# Patient Record
Sex: Female | Born: 1970 | Race: White | Hispanic: No | Marital: Married | Smoking: Never smoker
Health system: Southern US, Community
[De-identification: ages and names within clinical notes are randomized; demographics above are authoritative.]

## PROBLEM LIST (undated history)

## (undated) DIAGNOSIS — G4700 Insomnia, unspecified: Secondary | ICD-10-CM

## (undated) DIAGNOSIS — N390 Urinary tract infection, site not specified: Secondary | ICD-10-CM

## (undated) DIAGNOSIS — K635 Polyp of colon: Secondary | ICD-10-CM

## (undated) DIAGNOSIS — F419 Anxiety disorder, unspecified: Secondary | ICD-10-CM

## (undated) DIAGNOSIS — K219 Gastro-esophageal reflux disease without esophagitis: Secondary | ICD-10-CM

## (undated) DIAGNOSIS — K649 Unspecified hemorrhoids: Secondary | ICD-10-CM

## (undated) DIAGNOSIS — Z8669 Personal history of other diseases of the nervous system and sense organs: Secondary | ICD-10-CM

## (undated) HISTORY — DX: Gastro-esophageal reflux disease without esophagitis: K21.9

## (undated) HISTORY — DX: Personal history of other diseases of the nervous system and sense organs: Z86.69

## (undated) HISTORY — DX: Unspecified hemorrhoids: K64.9

## (undated) HISTORY — DX: Urinary tract infection, site not specified: N39.0

## (undated) HISTORY — DX: Polyp of colon: K63.5

## (undated) HISTORY — PX: COLONOSCOPY: SHX5424

## (undated) HISTORY — DX: Anxiety disorder, unspecified: F41.9

## (undated) HISTORY — PX: PARTIAL HYSTERECTOMY: SHX80

## (undated) HISTORY — DX: Insomnia, unspecified: G47.00

## (undated) HISTORY — PX: ESOPHAGOGASTRODUODENOSCOPY: SHX1529

---

## 2008-09-02 ENCOUNTER — Ambulatory Visit: Payer: Self-pay | Admitting: *Deleted

## 2009-08-08 ENCOUNTER — Ambulatory Visit (HOSPITAL_COMMUNITY): Admission: RE | Admit: 2009-08-08 | Discharge: 2009-08-09 | Payer: Self-pay | Admitting: Obstetrics and Gynecology

## 2009-08-09 ENCOUNTER — Encounter (INDEPENDENT_AMBULATORY_CARE_PROVIDER_SITE_OTHER): Payer: Self-pay | Admitting: Obstetrics and Gynecology

## 2009-08-10 ENCOUNTER — Inpatient Hospital Stay (HOSPITAL_COMMUNITY): Admission: AD | Admit: 2009-08-10 | Discharge: 2009-08-10 | Payer: Self-pay | Admitting: Obstetrics

## 2009-08-16 ENCOUNTER — Encounter: Admission: RE | Admit: 2009-08-16 | Discharge: 2009-08-16 | Payer: Self-pay | Admitting: Obstetrics and Gynecology

## 2009-09-28 ENCOUNTER — Ambulatory Visit (HOSPITAL_COMMUNITY): Admission: RE | Admit: 2009-09-28 | Discharge: 2009-09-28 | Payer: Self-pay | Admitting: Obstetrics and Gynecology

## 2010-02-25 IMAGING — CT CT ABDOMEN W/ CM
2 series · 15 of 42 positions shown, 19 images · IV contrast (CONTRAST)
Comparison: None.

CT ABDOMEN

CLINICAL DATA: Nausea, vomiting, gas pain, fainting, loss of
appetite. Hysterectomy 08/08/2009.

CT ABDOMEN AND PELVIS WITH CONTRAST
TECHNIQUE: Multidetector CT imaging of the abdomen and pelvis was
performed using the standard protocol following bolus
administration of intravenous contrast.
Contrast: 100 ml Hmnipaque-YZZ

[coronals · coronal · 0.82mm/px · 3 of 70 slices shown]
[im 31/70  soft-tissue]
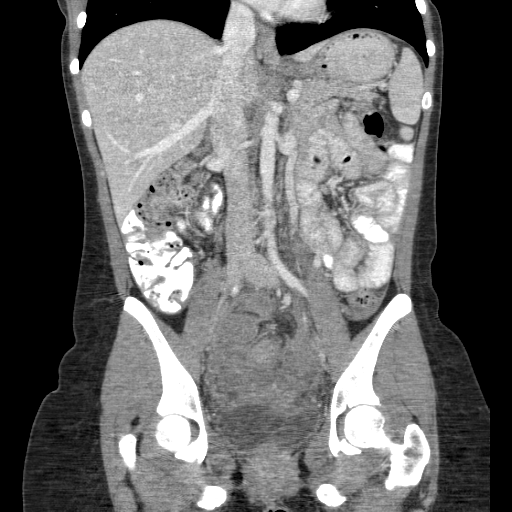
[im 32/70  soft-tissue]
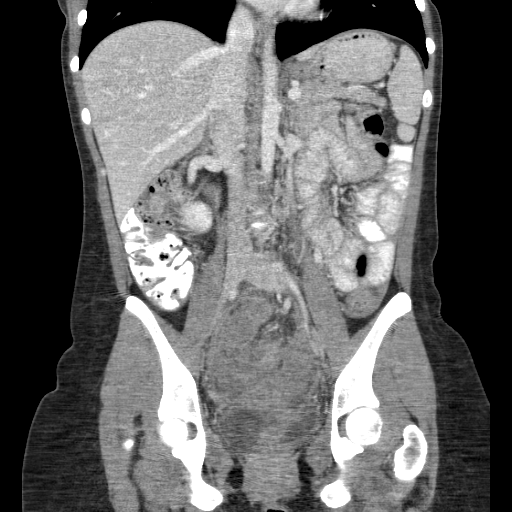
[im 38/70  soft-tissue]
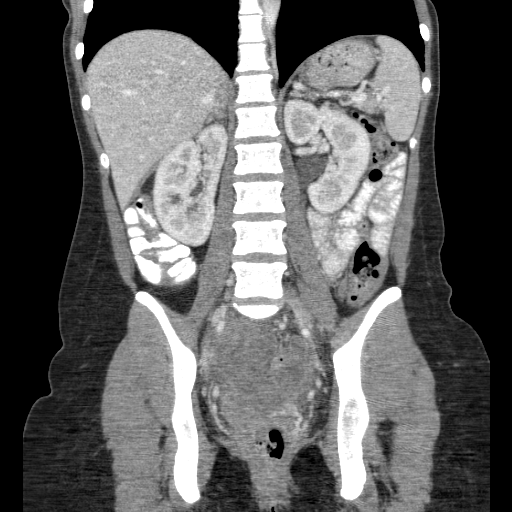

[sagittals · sagittal · 0.82mm/px · 12 of 111 slices shown, 16 images]
[im 6/111  lung]
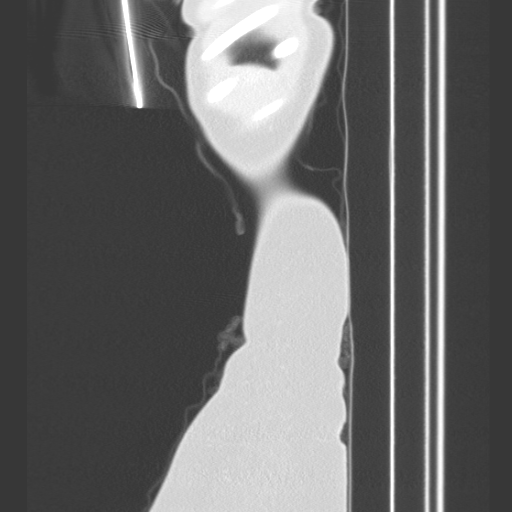
[im 12/111  soft-tissue]
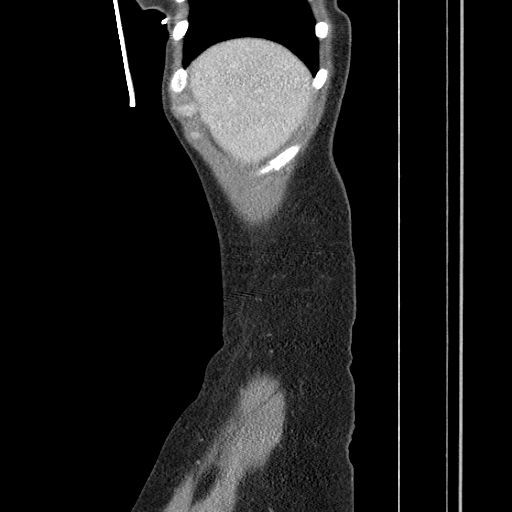
[im 12/111  lung]
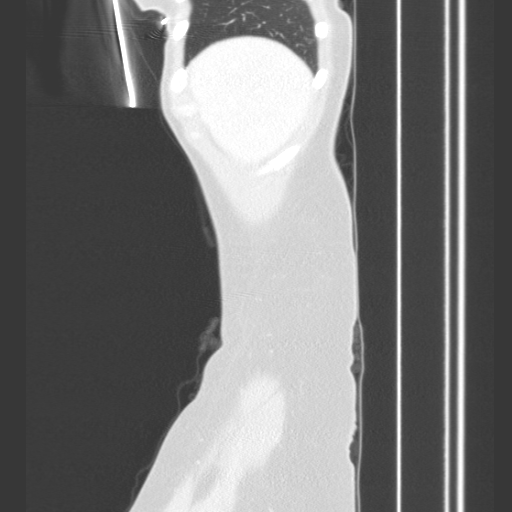
[im 12/111  bone]
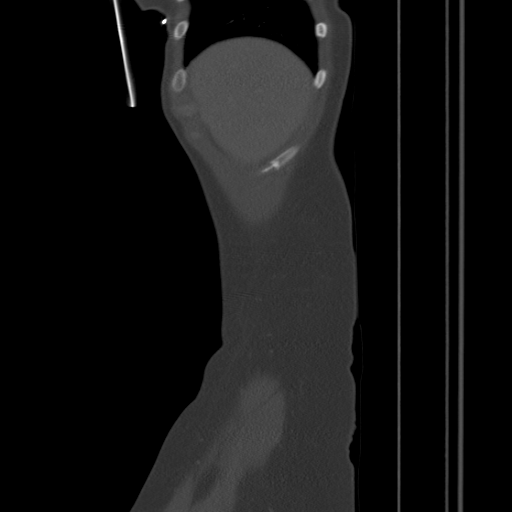
[im 18/111  lung]
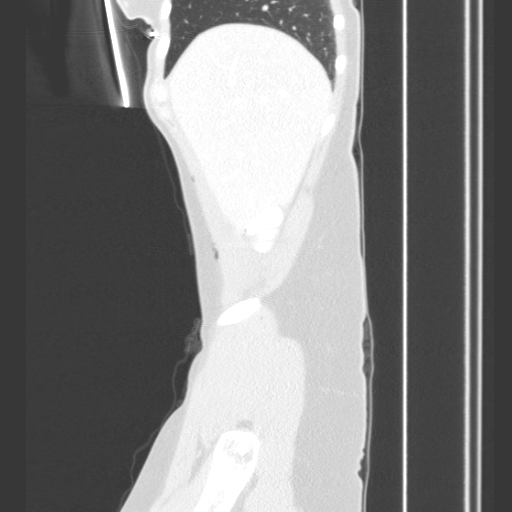
[im 24/111  soft-tissue]
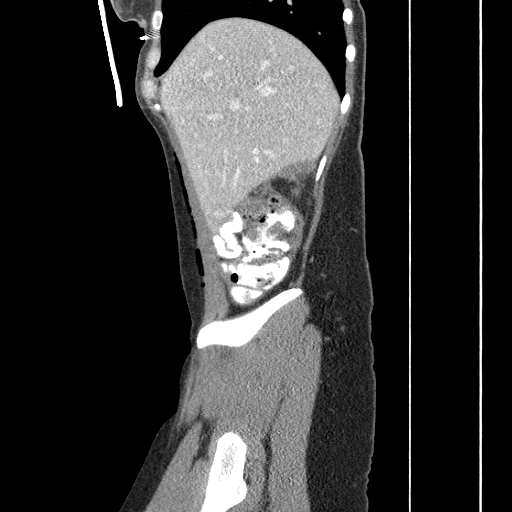
[im 24/111  lung]
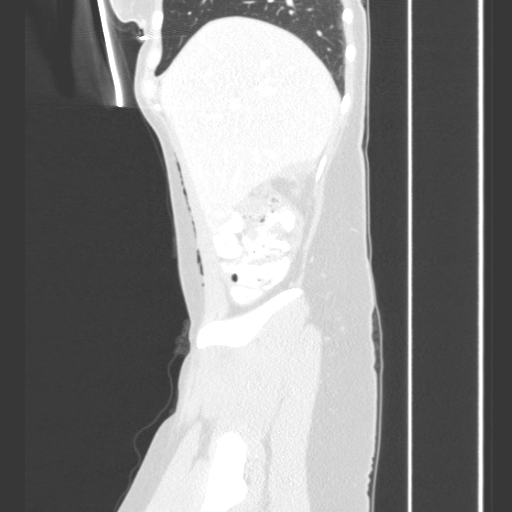
[im 35/111  soft-tissue]
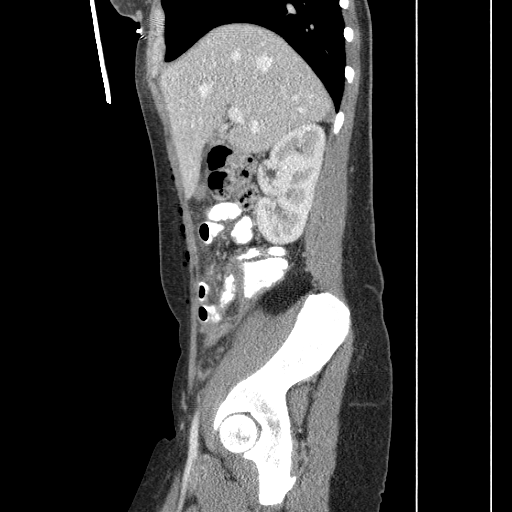
[im 41/111  soft-tissue]
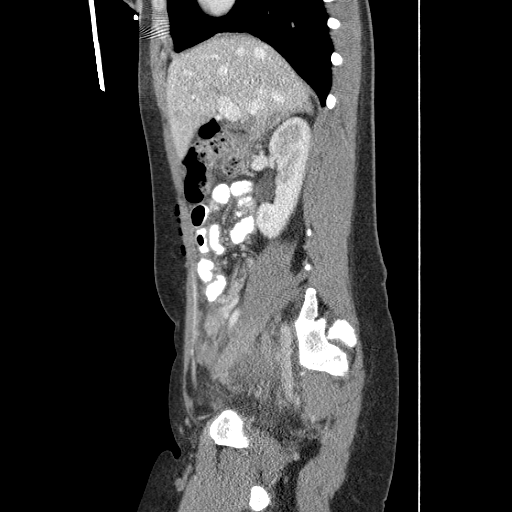
[im 53/111  soft-tissue]
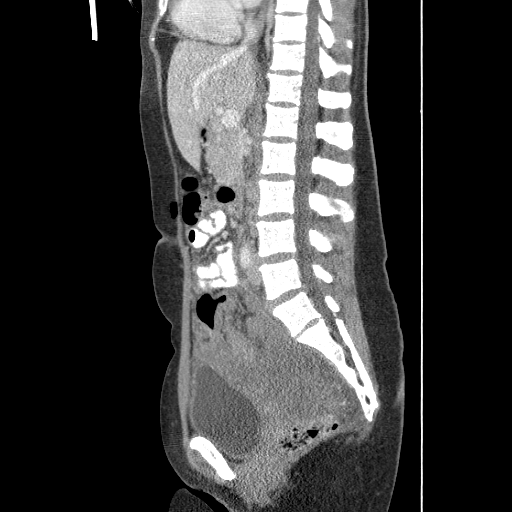
[im 64/111  soft-tissue]
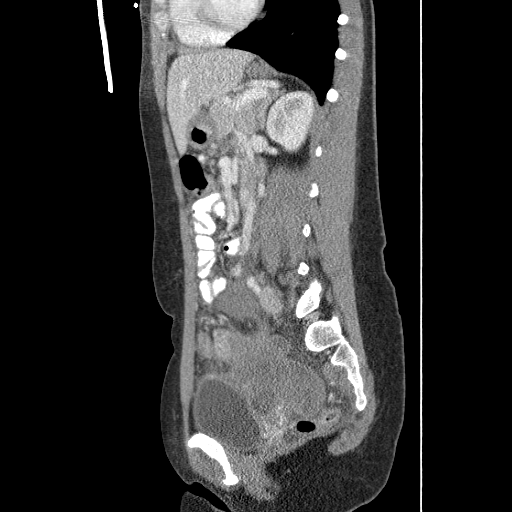
[im 74/111  soft-tissue]
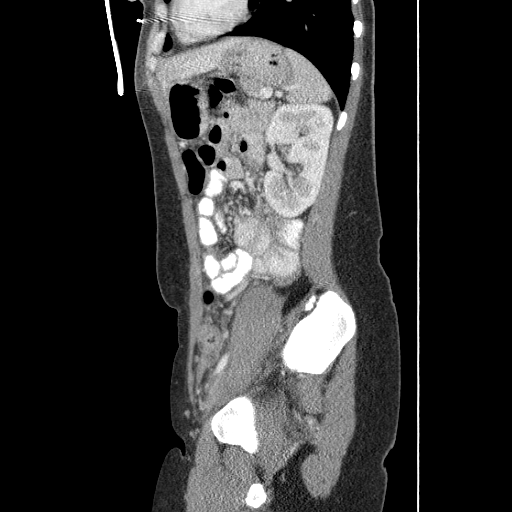
[im 82/111  soft-tissue]
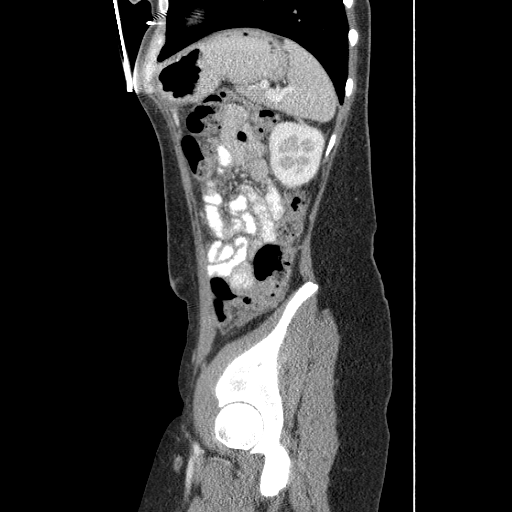
[im 93/111  soft-tissue]
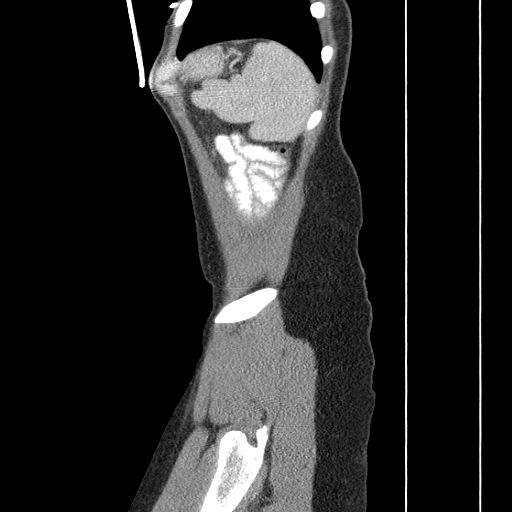
[im 93/111  bone]
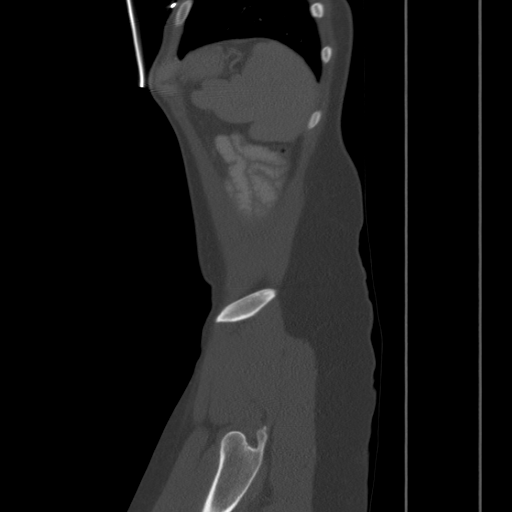
[im 105/111  soft-tissue]
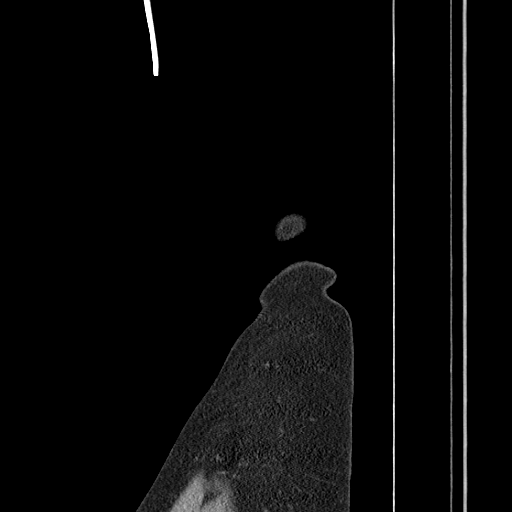

[15 of 42 positions shown; findings below may reference images not displayed]

FINDINGS: Lung bases show minimal subpleural atelectasis in the
right lower lobe.  Heart size normal.  No pericardial or pleural
effusion.

Liver, gallbladder, adrenal glands, kidneys, spleen, pancreas,
stomach and small bowel are unremarkable.  A small amount of fluid
is seen in Morison's pouch.  There is subcutaneous emphysema along
the right anterior abdominal wall.
IMPRESSION: 1.  Small amount of ascites in Morison's pouch.
2.  Subcutaneous emphysema along the anterior right abdominal wall
is likely related to recent hysterectomy.

CT PELVIS
FINDINGS: There is heterogeneous ascites.  The sigmoid and distal
descending colon appears thickened, but is underdistended.
Remainder of the colon, appendix and bladder are unremarkable.
There are postoperative changes of recent hysterectomy. No
worrisome lytic or sclerotic lesions.
IMPRESSION: Postoperative changes of recent hysterectomy with extensive
heterogeneous fluid and inflammatory findings in the pelvis.
Hemoperitoneum and/or abscess are considered.  Apparent thickening
of the wall of the distal descending and sigmoid colon may be
secondary to surrounding inflammatory changes.  A bowel injury is
not excluded.

## 2010-11-16 LAB — CBC
MCHC: 33.7 g/dL (ref 30.0–36.0)
MCV: 92.5 fL (ref 78.0–100.0)
Platelets: 392 10*3/uL (ref 150–400)
RBC: 4.3 MIL/uL (ref 3.87–5.11)
RDW: 13.4 % (ref 11.5–15.5)

## 2010-11-28 LAB — DIFFERENTIAL
Basophils Absolute: 0 10*3/uL (ref 0.0–0.1)
Basophils Relative: 0 % (ref 0–1)
Eosinophils Relative: 1 % (ref 0–5)
Monocytes Absolute: 0.8 10*3/uL (ref 0.1–1.0)
Monocytes Relative: 6 % (ref 3–12)
Neutro Abs: 10.6 10*3/uL — ABNORMAL HIGH (ref 1.7–7.7)

## 2010-11-28 LAB — CBC
HCT: 26.5 % — ABNORMAL LOW (ref 36.0–46.0)
HCT: 32.2 % — ABNORMAL LOW (ref 36.0–46.0)
Hemoglobin: 11.1 g/dL — ABNORMAL LOW (ref 12.0–15.0)
Hemoglobin: 8.9 g/dL — ABNORMAL LOW (ref 12.0–15.0)
MCHC: 33.7 g/dL (ref 30.0–36.0)
MCV: 93.4 fL (ref 78.0–100.0)
MCV: 94.1 fL (ref 78.0–100.0)
Platelets: 337 10*3/uL (ref 150–400)
RBC: 2.82 MIL/uL — ABNORMAL LOW (ref 3.87–5.11)
RBC: 3.44 MIL/uL — ABNORMAL LOW (ref 3.87–5.11)
RBC: 4.13 MIL/uL (ref 3.87–5.11)
RDW: 12.1 % (ref 11.5–15.5)
RDW: 12.4 % (ref 11.5–15.5)
WBC: 13.6 10*3/uL — ABNORMAL HIGH (ref 4.0–10.5)
WBC: 8.4 10*3/uL (ref 4.0–10.5)

## 2010-11-28 LAB — URINALYSIS, ROUTINE W REFLEX MICROSCOPIC
Bilirubin Urine: NEGATIVE
Ketones, ur: NEGATIVE mg/dL
Specific Gravity, Urine: 1.01 (ref 1.005–1.030)
Urobilinogen, UA: 0.2 mg/dL (ref 0.0–1.0)
pH: 5.5 (ref 5.0–8.0)

## 2010-11-28 LAB — URINE MICROSCOPIC-ADD ON

## 2010-11-28 LAB — HCG, SERUM, QUALITATIVE: Preg, Serum: NEGATIVE

## 2011-01-09 NOTE — Procedures (Signed)
DUPLEX DEEP VENOUS EXAM - LOWER EXTREMITY   INDICATION:  Right thigh pain.   HISTORY:  Edema:  No.  Trauma/Surgery:  No.  Pain:  One week of right thigh pain.  PE:  No.  Previous DVT:  No.  Anticoagulants:  No.  Other:  Patient had a recent long car trip.   DUPLEX EXAM:                CFV   SFV   PopV  PTV    GSV                R  L  R  L  R  L  R   L  R  L  Thrombosis    o  o  o     o     o      o  Spontaneous   +  +  +     +     +      +  Phasic        +  +  +     +     +      +  Augmentation  +  +  +     +     +      +  Compressible  +  +  +     +     +      +  Competent     +  +  +     +     +      +   Legend:  + - yes  o - no  p - partial  D - decreased   IMPRESSION:  No evidence of right leg deep or superficial venous  thrombosis or baker's cyst.    _____________________________  P. Liliane Bade, M.D.   MC/MEDQ  D:  09/02/2008  T:  09/02/2008  Job:  [id number]

## 2018-10-03 ENCOUNTER — Ambulatory Visit (INDEPENDENT_AMBULATORY_CARE_PROVIDER_SITE_OTHER): Payer: 59 | Admitting: Cardiology

## 2018-10-03 ENCOUNTER — Encounter: Payer: Self-pay | Admitting: Cardiology

## 2018-10-03 VITALS — BP 100/68 | HR 69 | Ht 60.0 in | Wt 140.4 lb

## 2018-10-03 DIAGNOSIS — R079 Chest pain, unspecified: Secondary | ICD-10-CM | POA: Diagnosis not present

## 2018-10-03 DIAGNOSIS — G4700 Insomnia, unspecified: Secondary | ICD-10-CM | POA: Insufficient documentation

## 2018-10-03 DIAGNOSIS — Z8669 Personal history of other diseases of the nervous system and sense organs: Secondary | ICD-10-CM | POA: Insufficient documentation

## 2018-10-03 NOTE — Progress Notes (Signed)
Cardiology Office Note    Date:  10/03/2018   ID:  Genesi, Stefanko May 02, 1971, MRN [id number]  PCP:  Bethena Roys, NP  Cardiologist:  Fransico Him, MD   Chief Complaint  Patient presents with  . Chest Pain    History of Present Illness:  Vicki Dixon is a 48 y.o. female who is being seen today for the evaluation of chest pain with left arm pain at the request of Bethena Roys, NP.  Is a 48 year old female past medical history of migraine headaches, anxiety, PCOS, insomnia who recently saw her PCP for complaints of chest pain.  She says the pain started over the last weekend.  She says it was a pressure sensation with some sharpness in the midportion of her chest over into the left chest.  There was no radiation into her left arm but she did have pain in her left scapula.  There was no associated shortness of breath, nausea, vomiting or diaphoresis.  She stopped exercising because she became very concerned that this could be cardiac.  The pain was worse with movements.  It would wake her up at night when she would turn or roll over or get out of bed.  Since this pain started has significantly improved since last weekend.   it was felt by her PCP to be musculoskeletal versus GERD.  She was started on Protonix 40 mg daily but had not gotten the prescription filled..  She was also prescribed ibuprofen as needed.    She is now referred to rule out a cardiac source as well.  Denies any shortness of breath, nausea, diaphoresis with pain.  She denies any palpitations, presyncope, syncope or lower extremity edema.  Her pain is significantly improved since the weekend.  He has never smoked and has no family history of cardiac disease.  Past Medical History:  Diagnosis Date  . Anxiety   . History of migraine headaches   . Insomnia     History reviewed. No pertinent surgical history.  Current Medications: Current Meds  Medication Sig  . pantoprazole (PROTONIX) 40 MG  tablet Take 1 tablet by mouth daily.    Allergies:   Patient has no allergy information on record.   Social History   Socioeconomic History  . Marital status: Married    Spouse name: Not on file  . Number of children: 2  . Years of education: Not on file  . Highest education level: Not on file  Occupational History  . 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 Financial resource strain: Not on file  . Food insecurity:    Worry: Not on file    Inability: Not on file  . Transportation needs:    Medical: Not on file    Non-medical: Not on file  Tobacco Use  . Smoking status: Never Smoker  . Smokeless tobacco: Never Used  Substance and Sexual Activity  . Alcohol use: Never    Frequency: Never  . Drug use: Never  . Sexual activity: Not on file  Lifestyle  . Physical activity:    Days per week: Not on file    Minutes per session: Not on file  . Stress: Not on file  Relationships  . Social connections:    Talks on phone: Not on file    Gets together: Not on file    Attends religious service: Not on file    Active member of club or  organization: Not on file    Attends meetings of clubs or organizations: Not on file    Relationship status: Not on file  Other Topics Concern  . Not on file  Social History Narrative  . Not on file     Family History:  The patient's family history includes Cancer in her maternal grandfather and maternal grandmother; Deep vein thrombosis in her father; Hyperlipidemia in her father.   ROS:   Please see the history of present illness.    ROS All other systems reviewed and are negative.  No flowsheet data found.     PHYSICAL EXAM:   VS:  BP 100/68   Pulse 69   Ht 5' (1.524 m)   Wt 140 lb 6.4 oz (63.7 kg)   SpO2 98%   BMI 27.42 kg/m    GEN: Well nourished, well developed, in no acute distress  HEENT: normal  Neck: no JVD, carotid bruits, or masses Cardiac: RRR; no murmurs, rubs, or gallops,no  edema.  Intact distal pulses bilaterally.  Respiratory:  clear to auscultation bilaterally, normal work of breathing GI: soft, nontender, nondistended, + BS MS: no deformity or atrophy  Skin: warm and dry, no rash Neuro:  Alert and Oriented x 3, Strength and sensation are intact Psych: euthymic mood, full affect  Wt Readings from Last 3 Encounters:  10/03/18 140 lb 6.4 oz (63.7 kg)      Studies/Labs Reviewed:   EKG:  EKG is not ordered today.    Recent Labs: No results found for requested labs within last 8760 hours.   Lipid Panel No results found for: CHOL, TRIG, HDL, CHOLHDL, VLDL, LDLCALC, LDLDIRECT  Additional studies/ records that were reviewed today include:  Office notes from PCP    ASSESSMENT:    1. Chest pain, unspecified type      PLAN:  In order of problems listed above:  Chest pain - her symptoms are very atypical and are more consistent with a musculoskeletal etiology.  EKG from her PCP office was showed normal sinus rhythm with no ST changes and normal intervals.  He has no cardiac risk factors for CAD.  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  I will order a stress echo to rule out ischemia.  We will also be able to assess for valvular abnormalities at the same time.   Medication Adjustments/Labs and Tests Ordered: Current medicines are reviewed at length with the patient today.  Concerns regarding medicines are outlined above.  Medication changes, Labs and Tests ordered today are listed in the Patient Instructions below.  There are no Patient Instructions on file for this visit.   Signed, Fransico Him, MD  10/03/2018 2:23 PM    [facility] [address], [place], [place]  [address] Phone: [phone]; Fax: [phone]

## 2018-10-03 NOTE — Addendum Note (Signed)
Addended by: Sarina Ill on: 10/03/2018 02:48 PM   Modules accepted: Orders

## 2018-10-03 NOTE — Patient Instructions (Signed)
Medication Instructions:  Your physician recommends that you continue on your current medications as directed. Please refer to the Current Medication list given to you today.  If you need a refill on your cardiac medications before your next appointment, please call your pharmacy.   Lab work: None If you have labs (blood work) drawn today and your tests are completely normal, you will receive your results only by: Marland Kitchen MyChart Message (if you have MyChart) OR . A paper copy in the mail If you have any lab test that is abnormal or we need to change your treatment, we will call you to review the results.  Testing/Procedures: Your physician has requested that you have a stress echocardiogram. For further information please visit [email or web address]. Please follow instruction sheet as given.  Follow-Up: As needed after results.

## 2018-10-06 ENCOUNTER — Telehealth (HOSPITAL_COMMUNITY): Payer: Self-pay | Admitting: *Deleted

## 2018-10-08 ENCOUNTER — Encounter

## 2018-10-08 ENCOUNTER — Ambulatory Visit (HOSPITAL_BASED_OUTPATIENT_CLINIC_OR_DEPARTMENT_OTHER): Payer: 59

## 2018-10-08 ENCOUNTER — Ambulatory Visit (HOSPITAL_COMMUNITY): Payer: 59 | Attending: Cardiology

## 2018-10-08 DIAGNOSIS — R079 Chest pain, unspecified: Secondary | ICD-10-CM

## 2018-10-09 ENCOUNTER — Other Ambulatory Visit (HOSPITAL_COMMUNITY): Payer: 59

## 2020-07-01 ENCOUNTER — Encounter: Payer: Self-pay | Admitting: Physician Assistant

## 2020-07-15 ENCOUNTER — Ambulatory Visit (INDEPENDENT_AMBULATORY_CARE_PROVIDER_SITE_OTHER): Payer: 59 | Admitting: Physician Assistant

## 2020-07-15 ENCOUNTER — Encounter: Payer: Self-pay | Admitting: Physician Assistant

## 2020-07-15 VITALS — BP 90/60 | HR 64 | Ht 60.0 in | Wt 139.2 lb

## 2020-07-15 DIAGNOSIS — K219 Gastro-esophageal reflux disease without esophagitis: Secondary | ICD-10-CM

## 2020-07-15 DIAGNOSIS — Z1212 Encounter for screening for malignant neoplasm of rectum: Secondary | ICD-10-CM | POA: Diagnosis not present

## 2020-07-15 DIAGNOSIS — Z1211 Encounter for screening for malignant neoplasm of colon: Secondary | ICD-10-CM | POA: Diagnosis not present

## 2020-07-15 MED ORDER — SUTAB 1479-225-188 MG PO TABS: 1.0000 | ORAL_TABLET | Freq: Once | ORAL | 0 refills | Status: AC

## 2020-07-15 NOTE — Patient Instructions (Signed)
If you are age 49 or older, your body mass index should be between 23-30. Your Body mass index is 27.19 kg/m. If this is out of the aforementioned range listed, please consider follow up with your Primary Care Provider.  If you are age 16 or younger, your body mass index should be between 19-25. Your Body mass index is 27.19 kg/m. If this is out of the aformentioned range listed, please consider follow up with your Primary Care Provider.   You have been scheduled for an endoscopy and colonoscopy. Please follow the written instructions given to you at your visit today. Please pick up your prep supplies at the pharmacy within the next 1-3 days. If you use inhalers (even only as needed), please bring them with you on the day of your procedure.  Thank you for choosing me and [facility].  Ellouise Newer, PA-C

## 2020-07-15 NOTE — Progress Notes (Signed)
Chief Complaint: GERD and CRC screening  HPI:    Vicki Dixon is a 49 year old female with a past medical history as listed below, who was referred to me by Marton Redwood, MD for a complaint of reflux and CRC screening.    Today, the patient presents to clinic and tells me that a year ago she was having a lot of chest pain especially at night with a lot of burping and saw cardiology and was eventually diagnosed with reflux symptoms.  She was started on Pantoprazole 40 mg daily and since then watching what she eats she really has no problem with this anymore.  Does tell me she had 2 years before that of undiagnosed symptoms.    Denies any other GI complaints including fever, chills, weight loss, change in bowel habits, abdominal pain, change in bowel habits or blood in her stool.  Past Medical History:  Diagnosis Date  . Anxiety   . History of migraine headaches   . Insomnia     No past surgical history on file.  Current Outpatient Medications  Medication Sig Dispense Refill  . pantoprazole (PROTONIX) 40 MG tablet Take 1 tablet by mouth daily.     No current facility-administered medications for this visit.    Allergies as of 07/15/2020  . (Not on File)    Family History  Problem Relation Age of Onset  . Hyperlipidemia Father   . Deep vein thrombosis Father   . Cancer Maternal Grandmother   . Cancer Maternal Grandfather     Social History   Socioeconomic History  . Marital status: Married    Spouse name: Not on file  . Number of children: 2  . Years of education: Not on file  . Highest education level: Not on file  Occupational History  . 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 Smoking status: Never Smoker  . Smokeless tobacco: Never Used  Vaping Use  . Vaping Use: Never used  Substance and Sexual Activity  . Alcohol use: Never  . Drug use: Never  . Sexual activity: Not on file  Other Topics Concern  . Not on file    Social History Narrative  . Not on file   Social Determinants of Health   Financial Resource Strain:   . Difficulty of Paying Living Expenses: Not on file  Food Insecurity:   . Worried About Charity fundraiser in the Last Year: Not on file  . Ran Out of Food in the Last Year: Not on file  Transportation Needs:   . Lack of Transportation (Medical): Not on file  . Lack of Transportation (Non-Medical): Not on file  Physical Activity:   . Days of Exercise per Week: Not on file  . Minutes of Exercise per Session: Not on file  Stress:   . Feeling of Stress : Not on file  Social Connections:   . Frequency of Communication with Friends and Family: Not on file  . Frequency of Social Gatherings with Friends and Family: Not on file  . Attends Religious Services: Not on file  . Active Member of Clubs or Organizations: Not on file  . Attends Archivist Meetings: Not on file  . Marital Status: Not on file  Intimate Partner Violence:   . Fear of Current or Ex-Partner: Not on file  . Emotionally Abused: Not on file  . Physically Abused: Not on file  . Sexually Abused: Not on file  Review of Systems:    Constitutional: No weight loss, fever or chills Skin: No rash  Cardiovascular: No chest pain Respiratory: No SOB  Gastrointestinal: See HPI and otherwise negative Genitourinary: No dysuria  Neurological: No headache, dizziness or syncope Musculoskeletal: No new muscle or joint pain Hematologic: No bleeding Psychiatric: No history of depression or anxiety   Physical Exam:  Vital signs: BP 90/60 (BP Location: Left Arm, Patient Position: Sitting)   Pulse 64   Ht 5' (1.524 m)   Wt 139 lb 3.2 oz (63.1 kg)   SpO2 99%   BMI 27.19 kg/m   Constitutional:   Pleasant Caucasian female appears to be in NAD, Well developed, Well nourished, alert and cooperative Head:  Normocephalic and atraumatic. Eyes:   PEERL, EOMI. No icterus. Conjunctiva pink. Ears:  Normal auditory  acuity. Neck:  Supple Throat: Oral cavity and pharynx without inflammation, swelling or lesion.  Respiratory: Respirations even and unlabored. Lungs clear to auscultation bilaterally.   No wheezes, crackles, or rhonchi.  Cardiovascular: Normal S1, S2. No MRG. Regular rate and rhythm. No peripheral edema, cyanosis or pallor.  Gastrointestinal:  Soft, nondistended, nontender. No rebound or guarding. Normal bowel sounds. No appreciable masses or hepatomegaly. Rectal:  Not performed.  Msk:  Symmetrical without gross deformities. Without edema, no deformity or joint abnormality.  Neurologic:  Alert and  oriented x4;  grossly normal neurologically.  Skin:   Dry and intact without significant lesions or rashes. Psychiatric: Demonstrates good judgement and reason without abnormal affect or behaviors.  No recent labs or imaging.  Assessment: 1.  GERD: Symptoms diagnosed as reflux a year ago, symptoms for 2 years prior to that with chest pain and burning/burping mainly at night, better now with pantoprazole 40 mg daily 2.  Screening for colorectal cancer: Patient is 40 and never had screening  Plan: 1.  Scheduled patient for a screening colonoscopy and diagnostic EGD due to symptoms of reflux with Dr. Havery Moros in the [facility].  Did discuss risks, benefits, limitations and alternatives and the patient agrees to proceed.  She has had both of her Covid vaccines and a booster. 2.  Patient did ask questions about hemorrhoid banding in the future.  Told her this could be better evaluated at time of her colonoscopy to see if she would be a candidate for this in the future. 3.  Patient to follow in clinic per recommendations from Dr. Havery Moros after time of procedures.  Vicki Newer, PA-C [facility] 07/15/2020, 8:32 AM  Cc: Marton Redwood, MD

## 2020-07-15 NOTE — Progress Notes (Signed)
Agree with assessment and plan as outlined.  

## 2020-07-29 ENCOUNTER — Other Ambulatory Visit: Payer: Self-pay

## 2020-07-29 ENCOUNTER — Ambulatory Visit (AMBULATORY_SURGERY_CENTER): Payer: 59 | Admitting: Gastroenterology

## 2020-07-29 ENCOUNTER — Encounter: Payer: Self-pay | Admitting: Gastroenterology

## 2020-07-29 VITALS — BP 101/57 | HR 63 | Temp 98.7°F | Resp 14 | Ht 60.0 in | Wt 139.0 lb

## 2020-07-29 DIAGNOSIS — K635 Polyp of colon: Secondary | ICD-10-CM

## 2020-07-29 DIAGNOSIS — Z1211 Encounter for screening for malignant neoplasm of colon: Secondary | ICD-10-CM | POA: Diagnosis present

## 2020-07-29 DIAGNOSIS — D120 Benign neoplasm of cecum: Secondary | ICD-10-CM | POA: Diagnosis not present

## 2020-07-29 DIAGNOSIS — K219 Gastro-esophageal reflux disease without esophagitis: Secondary | ICD-10-CM | POA: Diagnosis not present

## 2020-07-29 DIAGNOSIS — K2970 Gastritis, unspecified, without bleeding: Secondary | ICD-10-CM

## 2020-07-29 DIAGNOSIS — K2950 Unspecified chronic gastritis without bleeding: Secondary | ICD-10-CM | POA: Diagnosis not present

## 2020-07-29 DIAGNOSIS — D122 Benign neoplasm of ascending colon: Secondary | ICD-10-CM

## 2020-07-29 MED ORDER — SODIUM CHLORIDE 0.9 % IV SOLN: 500.0000 mL | Freq: Once | INTRAVENOUS | Status: DC

## 2020-07-29 NOTE — Patient Instructions (Signed)
Handouts given:  Polyps, gastritis Resume previous diet Continue present medications Minimize NSAID use:  I.e. aspirin, motrin, aleve, goody powder Await pathology results  YOU HAD AN ENDOSCOPIC PROCEDURE TODAY AT [place]:   Refer to the procedure report that was given to you for any specific questions about what was found during the examination.  If the procedure report does not answer your questions, please call your gastroenterologist to clarify.  If you requested that your care partner not be given the details of your procedure findings, then the procedure report has been included in a sealed envelope for you to review at your convenience later.  YOU SHOULD EXPECT: Some feelings of bloating in the abdomen. Passage of more gas than usual.  Walking can help get rid of the air that was put into your GI tract during the procedure and reduce the bloating. If you had a lower endoscopy (such as a colonoscopy or flexible sigmoidoscopy) you may notice spotting of blood in your stool or on the toilet paper. If you underwent a bowel prep for your procedure, you may not have a normal bowel movement for a few days.  Please Note:  You might notice some irritation and congestion in your nose or some drainage.  This is from the oxygen used during your procedure.  There is no need for concern and it should clear up in a day or so.  SYMPTOMS TO REPORT IMMEDIATELY:   Following lower endoscopy (colonoscopy or flexible sigmoidoscopy):  Excessive amounts of blood in the stool  Significant tenderness or worsening of abdominal pains  Swelling of the abdomen that is new, acute  Fever of 100F or higher   Following upper endoscopy (EGD)  Vomiting of blood or coffee ground material  New chest pain or pain under the shoulder blades  Painful or persistently difficult swallowing  New shortness of breath  Fever of 100F or higher  Black, tarry-looking stools  For urgent or emergent issues, a  gastroenterologist can be reached at any hour by calling [phone]. Do not use MyChart messaging for urgent concerns.   DIET:  We do recommend a small meal at first, but then you may proceed to your regular diet.  Drink plenty of fluids but you should avoid alcoholic beverages for 24 hours.  ACTIVITY:  You should plan to take it easy for the rest of today and you should NOT DRIVE or use heavy machinery until tomorrow (because of the sedation medicines used during the test).    FOLLOW UP: Our staff will call the number listed on your records 48-72 hours following your procedure to check on you and address any questions or concerns that you may have regarding the information given to you following your procedure. If we do not reach you, we will leave a message.  We will attempt to reach you two times.  During this call, we will ask if you have developed any symptoms of COVID 19. If you develop any symptoms (ie: fever, flu-like symptoms, shortness of breath, cough etc.) before then, please call [phone].  If you test positive for Covid 19 in the 2 weeks post procedure, please call and report this information to [place].    If any biopsies were taken you will be contacted by phone or by letter within the next 1-3 weeks.  Please call us at [phone] if you have not heard about the biopsies in 3 weeks.   SIGNATURES/CONFIDENTIALITY: You and/or your care partner have signed  paperwork which will be entered into your electronic medical record.  These signatures attest to the fact that that the information above on your After Visit Summary has been reviewed and is understood.  Full responsibility of the confidentiality of this discharge information lies with you and/or your care-partner.

## 2020-07-29 NOTE — Progress Notes (Signed)
PT taken to PACU. Monitors in place. VSS. Report given to RN. 

## 2020-07-29 NOTE — Op Note (Signed)
[facility] Patient Name: Vicki Dixon Procedure Date: 07/29/2020 1:22 PM MRN: [id number] Endoscopist: Remo Lipps P. Havery Moros , MD Age: 49 Referring MD:  Date of Birth: [birth year] Gender: Female Account #: [id number] Procedure:                Upper GI endoscopy Indications:              history of gastro-esophageal reflux disease, on                            protonix 40mg  once daily - occasional breakthrough                            symptoms Medicines:                Monitored Anesthesia Care Procedure:                Pre-Anesthesia Assessment:                           - Prior to the procedure, a History and Physical                            was performed, and patient medications and                            allergies were reviewed. The patient's tolerance of                            previous anesthesia was also reviewed. The risks                            and benefits of the procedure and the sedation                            options and risks were discussed with the patient.                            All questions were answered, and informed consent                            was obtained. Prior Anticoagulants: The patient has                            taken no previous anticoagulant or antiplatelet                            agents. ASA Grade Assessment: II - A patient with                            mild systemic disease. After reviewing the risks                            and benefits, the patient was deemed in  satisfactory condition to undergo the procedure.                           After obtaining informed consent, the endoscope was                            passed under direct vision. Throughout the                            procedure, the patient's blood pressure, pulse, and                            oxygen saturations were monitored continuously. The                            Endoscope was introduced through the  mouth, and                            advanced to the second part of duodenum. The upper                            GI endoscopy was accomplished without difficulty.                            The patient tolerated the procedure well. Scope In: Scope Out: Findings:                 Esophagogastric landmarks were identified: the                            Z-line was found at 37 cm, the gastroesophageal                            junction was found at 37 cm and the upper extent of                            the gastric folds was found at 37 cm from the                            incisors.                           The exam of the esophagus was otherwise normal. No                            esophagitis or Barrett's                           Patchy inflammation characterized by adherent                            blood, erythema and friability was found in the                            gastric fundus  and in the proximal gastric body.                           The exam of the stomach was otherwise normal.                           Biopsies were taken with a cold forceps in the                            gastric body, at the incisura and in the gastric                            antrum for Helicobacter pylori testing.                           The duodenal bulb and second portion of the                            duodenum were normal. Complications:            No immediate complications. Estimated blood loss:                            Minimal. Estimated Blood Loss:     Estimated blood loss was minimal. Impression:               - Esophagogastric landmarks identified.                           - Normal esophagus otherwise                           - Mild gastritis in the proximal stomach.                           - Normal stomach otherwise - biopsies taken to rule                            out H pylori                           - Normal duodenal bulb and second portion of the                             duodenum. Recommendation:           - Patient has a contact number available for                            emergencies. The signs and symptoms of potential                            delayed complications were discussed with the                            patient. Return to normal activities tomorrow.  Written discharge instructions were provided to the                            patient.                           - Resume previous diet.                           - Continue present medications.                           - Minimize NSAID use                           - Await pathology results. Remo Lipps P. Vicki Palleschi, MD 07/29/2020 2:04:54 PM This report has been signed electronically.

## 2020-07-29 NOTE — Progress Notes (Signed)
Called to room to assist during endoscopic procedure.  Patient ID and intended procedure confirmed with present staff. Received instructions for my participation in the procedure from the performing physician.  

## 2020-07-29 NOTE — Op Note (Signed)
[facility] Patient Name: Vicki Dixon Procedure Date: 07/29/2020 1:21 PM MRN: [id number] Endoscopist: Remo Lipps P. Havery Moros , MD Age: 49 Referring MD:  Date of Birth: [birth year] Gender: Female Account #: [id number] Procedure:                Colonoscopy Indications:              Screening for colorectal malignant neoplasm, This                            is the patient's first colonoscopy Medicines:                Monitored Anesthesia Care Procedure:                Pre-Anesthesia Assessment:                           - Prior to the procedure, a History and Physical                            was performed, and patient medications and                            allergies were reviewed. The patient's tolerance of                            previous anesthesia was also reviewed. The risks                            and benefits of the procedure and the sedation                            options and risks were discussed with the patient.                            All questions were answered, and informed consent                            was obtained. Prior Anticoagulants: The patient has                            taken no previous anticoagulant or antiplatelet                            agents. ASA Grade Assessment: II - A patient with                            mild systemic disease. After reviewing the risks                            and benefits, the patient was deemed in                            satisfactory condition to undergo the procedure.  After obtaining informed consent, the colonoscope                            was passed under direct vision. Throughout the                            procedure, the patient's blood pressure, pulse, and                            oxygen saturations were monitored continuously. The                            Colonoscope was introduced through the anus and                            advanced to the the  cecum, identified by                            appendiceal orifice and ileocecal valve. The                            colonoscopy was performed without difficulty. The                            patient tolerated the procedure well. The quality                            of the bowel preparation was good. The ileocecal                            valve, appendiceal orifice, and rectum were                            photographed. Scope In: 1:35:13 PM Scope Out: 1:56:18 PM Scope Withdrawal Time: 0 hours 17 minutes 27 seconds  Total Procedure Duration: 0 hours 21 minutes 5 seconds  Findings:                 The perianal and digital rectal examinations were                            normal.                           A 3 to 4 mm polyp was found in the cecum. The polyp                            was flat. The polyp was removed with a cold snare.                            Resection and retrieval were complete.                           A 5 mm polyp was found in the ascending colon. The  polyp was flat. The polyp was removed with a cold                            snare. Resection and retrieval were complete.                           There colon was spastic.                           Internal hemorrhoids were found during                            retroflexion. The hemorrhoids were small.                           The exam was otherwise without abnormality. Complications:            No immediate complications. Estimated blood loss:                            Minimal. Estimated Blood Loss:     Estimated blood loss was minimal. Impression:               - One 3 to 4 mm polyp in the cecum, removed with a                            cold snare. Resected and retrieved.                           - One 5 mm polyp in the ascending colon, removed                            with a cold snare. Resected and retrieved.                           - Colonic spasm.                            - Internal hemorrhoids.                           - The examination was otherwise normal. Recommendation:           - Patient has a contact number available for                            emergencies. The signs and symptoms of potential                            delayed complications were discussed with the                            patient. Return to normal activities tomorrow.                            Written discharge instructions were provided to the  patient.                           - Resume previous diet.                           - Continue present medications.                           - Await pathology results. Remo Lipps P. Havery Moros, MD 07/29/2020 2:00:48 PM This report has been signed electronically.

## 2020-07-29 NOTE — Progress Notes (Signed)
VS by CW  I have reviewed the patient's medical history in detail and updated the computerized patient record.  

## 2020-08-02 ENCOUNTER — Telehealth: Payer: Self-pay

## 2020-08-02 ENCOUNTER — Telehealth: Payer: Self-pay | Admitting: *Deleted

## 2020-08-02 NOTE — Telephone Encounter (Signed)
Left message on follow up call. 

## 2020-08-02 NOTE — Telephone Encounter (Signed)
  Follow up Call-  Call back number 07/29/2020  Post procedure Call Back phone  # [phone]  Permission to leave phone message Yes  Some recent data might be hidden     Patient questions:  Do you have a fever, pain , or abdominal swelling? No. Pain Score  0 *  Have you tolerated food without any problems? Yes.    Have you been able to return to your normal activities? Yes.    Do you have any questions about your discharge instructions: Diet   No. Medications  No. Follow up visit  No.  Do you have questions or concerns about your Care? No.  Actions: * If pain score is 4 or above: No action needed, pain <4.  1. Have you developed a fever since your procedure? no  2.   Have you had an respiratory symptoms (SOB or cough) since your procedure? no  3.   Have you tested positive for COVID 19 since your procedure no  4.   Have you had any family members/close contacts diagnosed with the COVID 19 since your procedure?  no   If yes to any of these questions please route to Joylene John, RN and Joella Prince, RN

## 2023-11-05 ENCOUNTER — Other Ambulatory Visit (HOSPITAL_COMMUNITY): Payer: Self-pay | Admitting: Registered Nurse

## 2023-11-05 DIAGNOSIS — R52 Pain, unspecified: Secondary | ICD-10-CM

## 2023-11-06 ENCOUNTER — Ambulatory Visit (HOSPITAL_COMMUNITY)
Admission: RE | Admit: 2023-11-06 | Discharge: 2023-11-06 | Disposition: A | Discharge status: Home / Self Care | Source: Ambulatory Visit | Attending: Internal Medicine | Admitting: Internal Medicine

## 2023-11-06 DIAGNOSIS — R52 Pain, unspecified: Secondary | ICD-10-CM

## 2023-11-06 DIAGNOSIS — M25562 Pain in left knee: Secondary | ICD-10-CM | POA: Insufficient documentation

## 2024-01-10 ENCOUNTER — Encounter: Payer: Self-pay | Admitting: Nurse Practitioner

## 2024-02-23 NOTE — Progress Notes (Unsigned)
      Ellouise Console, PA-C [address] [place], [place]  [address] Phone: [phone]   Gastroenterology Consultation  Referring Provider:     Loreli Elsie JONETTA Mickey., MD Primary Care Physician:  Loreli Elsie JONETTA Mickey., MD Primary Gastroenterologist:  Ellouise Console, PA-C / Elspeth Naval, MD  Reason for Consultation:     Hemorrhoids        HPI:   Vicki Dixon is a 53 y.o. y/o female referred for consultation & management  by Loreli Elsie JONETTA Mickey., MD.    07/2020 colonoscopy by Dr. Naval:  07/2020 EGD:  Past Medical History:  Diagnosis Date   Anxiety    History of migraine headaches    Insomnia    UTI (urinary tract infection)     Past Surgical History:  Procedure Laterality Date   PARTIAL HYSTERECTOMY      Prior to Admission medications   Medication Sig Start Date End Date Taking? Authorizing Provider  pantoprazole (PROTONIX) 40 MG tablet Take 1 tablet by mouth daily. 09/29/18   [provider]    Family History  Problem Relation Age of Onset   Hyperlipidemia Father    Deep vein thrombosis Father    Cancer Maternal Grandmother    Cancer Maternal Grandfather    Colon cancer Neg Hx    Pancreatic cancer Neg Hx    Rectal cancer Neg Hx    Esophageal cancer Neg Hx      Social History   Tobacco Use   Smoking status: Never   Smokeless tobacco: Never  Vaping Use   Vaping status: Never Used  Substance Use Topics   Alcohol use: Never   Drug use: Never    Allergies as of 02/24/2024   (No Known Allergies)    Review of Systems:    All systems reviewed and negative except where noted in HPI.   Physical Exam:  There were no vitals taken for this visit. No LMP recorded.  General:   Alert,  Well-developed, well-nourished, pleasant and cooperative in NAD Lungs:  Respirations even and unlabored.  Clear throughout to auscultation.   No wheezes, crackles, or rhonchi. No acute distress. Heart:  Regular rate and rhythm; no murmurs, clicks, rubs, or  gallops. Abdomen:  Normal bowel sounds.  No bruits.  Soft, and non-distended without masses, hepatosplenomegaly or hernias noted.  No Tenderness.  No guarding or rebound tenderness.    Neurologic:  Alert and oriented x3;  grossly normal neurologically. Psych:  Alert and cooperative. Normal mood and affect.  Imaging Studies: No results found.  Labs: CBC    Component Value Date/Time   WBC 7.8 09/28/2009 0844   RBC 4.30 09/28/2009 0844   HGB 13.4 09/28/2009 0844   HCT 39.8 09/28/2009 0844   PLT 392 09/28/2009 0844   MCV 92.5 09/28/2009 0844   MCHC 33.7 09/28/2009 0844   RDW 13.4 09/28/2009 0844   LYMPHSABS 1.5 08/10/2009 2039   MONOABS 0.8 08/10/2009 2039   EOSABS 0.1 08/10/2009 2039   BASOSABS 0.0 08/10/2009 2039    CMP  No results found for: NA, K, CL, CO2, GLUCOSE, BUN, CREATININE, CALCIUM, PROT, ALBUMIN, AST, ALT, ALKPHOS, BILITOT, GFRNONAA, GFRAA  Assessment and Plan:   Vicki Dixon is a 53 y.o. y/o female has been referred for ***  Follow up ***  Ellouise Console, PA-C

## 2024-02-24 ENCOUNTER — Telehealth: Payer: Self-pay | Admitting: Physician Assistant

## 2024-02-24 ENCOUNTER — Ambulatory Visit: Admitting: Physician Assistant

## 2024-02-24 ENCOUNTER — Encounter: Payer: Self-pay | Admitting: Physician Assistant

## 2024-02-24 VITALS — BP 110/70 | HR 66 | Ht 60.0 in | Wt 155.2 lb

## 2024-02-24 DIAGNOSIS — K648 Other hemorrhoids: Secondary | ICD-10-CM

## 2024-02-24 DIAGNOSIS — K219 Gastro-esophageal reflux disease without esophagitis: Secondary | ICD-10-CM | POA: Diagnosis not present

## 2024-02-24 DIAGNOSIS — K2970 Gastritis, unspecified, without bleeding: Secondary | ICD-10-CM

## 2024-02-24 DIAGNOSIS — Z860101 Personal history of adenomatous and serrated colon polyps: Secondary | ICD-10-CM | POA: Diagnosis not present

## 2024-02-24 DIAGNOSIS — K649 Unspecified hemorrhoids: Secondary | ICD-10-CM

## 2024-02-24 MED ORDER — HYDROCORTISONE ACETATE 25 MG RE SUPP: 25.0000 mg | Freq: Every day | RECTAL | 1 refills | Status: DC

## 2024-02-24 MED ORDER — OMEPRAZOLE 40 MG PO CPDR
40.0000 mg | DELAYED_RELEASE_CAPSULE | Freq: Every day | ORAL | 3 refills | Status: DC
Start: 2024-02-24 — End: 2024-05-26

## 2024-02-24 NOTE — Progress Notes (Signed)
 Agree with assessment and plan as outlined.

## 2024-02-24 NOTE — Patient Instructions (Signed)
 We have sent the following medications to your pharmacy for you to pick up at your convenience: Omeprazole 40 mg once daily and Hydrocortisone Suppositories 25 mg daily at bedtime  Please follow up sooner if symptoms increase or worsen  Due to recent changes in healthcare laws, you may see the results of your imaging and laboratory studies on MyChart before your provider has had a chance to review them.  We understand that in some cases there may be results that are confusing or concerning to you. Not all laboratory results come back in the same time frame and the provider may be waiting for multiple results in order to interpret others.  Please give us  48 hours in order for your provider to thoroughly review all the results before contacting the office for clarification of your results.   Thank you for trusting me with your gastrointestinal care!   Ellouise Console, PA-C _______________________________________________________  If your blood pressure at your visit was 140/90 or greater, please contact your primary care physician to follow up on this.  _______________________________________________________  If you are age 53 or older, your body mass index should be between 23-30. Your Body mass index is 30.32 kg/m. If this is out of the aforementioned range listed, please consider follow up with your Primary Care Provider.  If you are age 53 or younger, your body mass index should be between 19-25. Your Body mass index is 30.32 kg/m. If this is out of the aformentioned range listed, please consider follow up with your Primary Care Provider.   ________________________________________________________  The [facility] providers would like to encourage you to use MYCHART to communicate with providers for non-urgent requests or questions.  Due to long hold times on the telephone, sending your provider a message by [facility] may be a faster and more efficient way to get a response.  Please allow 48  business hours for a response.  Please remember that this is for non-urgent requests.  _______________________________________________________

## 2024-02-24 NOTE — Telephone Encounter (Signed)
 Patient called and stated that she has question about side effect from a medication that was prescribed to her today. In the side effect listed it mentions bone loss and bone fracture and patient is really concerned about that since she has history of bone loss in her family. Patient also mentioned that she has other patient paperwork staple to hers and doe snot know what to do. Please advise.

## 2024-02-24 NOTE — Telephone Encounter (Signed)
 Patient has concerns regarding possible side effects of omeprazole that  was prescribed to her today. She is told that bone density concerns typically apply more toward long term PPI use. Patient states provider told her there were no side effects. However, if any chance for bone density issues, increased risk of fracture, she cannot take the medication due to a strong family history of osteopenia etc.

## 2024-02-25 NOTE — Telephone Encounter (Signed)
 Spoke to patient and advised of response/recommendations from [facility] regarding PPI use vs H2 inhibitor use. Patient states that she will look up both medications and will decide what she wants to do at that time.

## 2024-02-26 ENCOUNTER — Ambulatory Visit: Admitting: Nurse Practitioner

## 2024-04-30 ENCOUNTER — Telehealth: Payer: Self-pay | Admitting: Physician Assistant

## 2024-04-30 NOTE — Telephone Encounter (Signed)
 Pt scheduled to see Dr Leigh 9/30@9 :50am. Pt aware of appt.

## 2024-04-30 NOTE — Telephone Encounter (Signed)
 Inbound call from pt stating that she does not want to continue discussing about her hemorrhoid and is wanting to just get her hemorrhoid removed. Patient is requesting a call back. Please advise.

## 2024-05-20 NOTE — Progress Notes (Unsigned)
 Vicki Console, PA-C [address] [place], [place]  [address] Phone: [phone]   Primary Care Physician: Vicki Dixon., MD  Primary Gastroenterologist:  Vicki Console, PA-C / Vicki Naval, MD   Chief Complaint: Follow-up internal hemorrhoids and GERD       HPI:   Vicki Dixon is a 53 y.o. female returns for 84-month follow-up of internal hemorrhoids and GERD.    3 months ago was started on Omeprazole  40mg  once daily for Acid Reflux.  Patient states she is not taking that medication due to concerns about developing Osteoporosis.  She states she has been diagnosed with Osteopenia.  Her father had severe osteoporosis and sister has osteopenia.  Patient does not want to take a PPI.  She has been taking OTC antiacid which is not controlling her acid reflux.  She reports burning in her chest and throat.  It is worse after eating and at bedtime when she lays down.  She has a lot of belching and regurgitation of food and acid after eating.  Reports history of acid reflux for many years.  She has had a few episodes of solid food dysphagia which food sticking in her mid upper chest.  She denies food bolus or vomiting episodes.  She has been avoiding GERD trigger foods and drinks.  She sleeps propped up on pillows at night.  She is very frustrated and wants more testing done to determine why she is having acid reflux.  She used hydrocortisone  25mg  suppositories prescribed at her last visit.  Hemorrhoids have been persistent.  She has internal hemorrhoids which protrude.  She lifts weights at the gym and she thinks that is what is exacerbating her hemorrhoids.  She has not had any external hemorrhoids.  No rectal bleeding.  She is scheduled for internal hemorrhoid banding in the next few weeks.  07/2020 colonoscopy by Dr. Naval: 2 small sessile serrated polyps removed.  Spastic colon.  Small internal hemorrhoids.  Good prep.  5-year repeat (due 07/2025).   07/2020 EGD: Mild  gastritis, otherwise normal.  No Barrett's.  Biopsies negative for H. pylori.   Current Outpatient Medications  Medication Sig Dispense Refill   famotidine  (PEPCID ) 40 MG tablet Take 1 tablet (40 mg total) by mouth 2 (two) times daily. 180 tablet 3   hydrocortisone  (ANUSOL -HC) 25 MG suppository Place 1 suppository (25 mg total) rectally at bedtime. 12 suppository 1   Multiple Vitamin (MULTI-VITAMIN) tablet Take 1 tablet by mouth daily.     omeprazole  (PRILOSEC) 40 MG capsule Take 1 capsule (40 mg total) by mouth daily. (Patient not taking: Reported on 05/21/2024) 90 capsule 3   No current facility-administered medications for this visit.    Allergies as of 05/21/2024   (No Known Allergies)    Past Medical History:  Diagnosis Date   Anxiety    Colon polyps    History of migraine headaches    Insomnia    UTI (urinary tract infection)     Past Surgical History:  Procedure Laterality Date   PARTIAL HYSTERECTOMY      Review of Systems:    All systems reviewed and negative except where noted in HPI.    Physical Exam:  BP 90/68   Pulse 88   Ht 5' (1.524 m)   Wt 151 lb 9.6 oz (68.8 kg)   BMI 29.61 kg/m  No LMP recorded.  General: Well-nourished, well-developed in no acute distress.  Lungs: Clear to auscultation bilaterally. Non-labored.  Heart: Regular rate and rhythm, no murmurs rubs or gallops.  Abdomen: Bowel sounds are normal; Abdomen is Soft; No hepatosplenomegaly, masses or hernias; mild epigastric abdominal Tenderness; rest of abdomen is nontender.  No guarding or rebound tenderness. Neuro: Alert and oriented x 3.  Grossly intact.  Psych: Alert and cooperative, anxious mood and affect.  Imaging Studies: No results found.  Labs: CBC    Component Value Date/Time   WBC 7.8 09/28/2009 0844   RBC 4.30 09/28/2009 0844   HGB 13.4 09/28/2009 0844   HCT 39.8 09/28/2009 0844   PLT 392 09/28/2009 0844   MCV 92.5 09/28/2009 0844   MCHC 33.7 09/28/2009 0844   RDW 13.4  09/28/2009 0844   LYMPHSABS 1.5 08/10/2009 2039   MONOABS 0.8 08/10/2009 2039   EOSABS 0.1 08/10/2009 2039   BASOSABS 0.0 08/10/2009 2039    CMP  No results found for: NA, K, CL, CO2, GLUCOSE, BUN, CREATININE, CALCIUM, PROT, ALBUMIN, AST, ALT, ALKPHOS, BILITOT, GFRNONAA, GFRAA    Assessment and Plan:   Vicki Dixon is a 53 y.o. y/o female returns for followup of:  1.  Internal hemorrhoids: Failed treatment with Hydrocortisone  Suppositories - We discussed etiology and treatment for hemorrhoids at length. - She will proceed with internal Hemorrhoid Banding as scheduled. - I instructed patient she may require 3 banding procedures.  I also advised that it is not guaranteed that hemorrhoids will not return. - I gave patient education printed brochure regarding internal hemorrhoid banding procedure today. - She denies any constipation or straining. -She will avoid heavy lifting for now.   2.  Chronic GERD for many years, still symptomatic.  History of mild gastritis (H. pylori negative).  Patient adamantly declines PPI therapy due to concerns about risk of osteoporosis. - Lengthy discussion regarding etiology and treatment for acid reflux. - Discussed adverse adverse side effects of PPI therapy.  Due to patient's concerns about osteoporosis, we will avoid PPIs. - Rx H2 RB: Famotidine  40 mg twice daily, #60, 11 refills. - She can continue OTC antacid as needed for breakthrough heartburn. - Continue lifestyle Modifications to prevent Acid Reflux.  Rec. Avoid coffee, sodas, peppermint, garlic, onions, alcohol, citrus fruits, chocolate, tomatoes, fatty and spicey foods.  Avoid eating 2-3 hours before bedtime.  Recommend weight loss. - Order upper GI series to evaluate for hiatal hernia. - I will reach out to Vicki Dixon to see if she would be a candidate for TIF procedure. - Consider 24-hour pH manometry test if symptoms persist.  3.  Solid food  dysphagia: Mild vague symptoms - Schedule repeat EGD with possible dilation - I discussed risks of EGD with patient to include risk of bleeding, perforation, and risk of sedation.  Patient expressed understanding and agrees to proceed with EGD.    4.  History of adenomatous colon polyps - 5-year repeat colonoscopy will be due 07/2025.   Vicki Console, PA-C  Follow up with Vicki Dixon after EGD procedure.

## 2024-05-21 ENCOUNTER — Encounter: Payer: Self-pay | Admitting: Physician Assistant

## 2024-05-21 ENCOUNTER — Ambulatory Visit: Admitting: Physician Assistant

## 2024-05-21 VITALS — BP 90/68 | HR 88 | Ht 60.0 in | Wt 151.6 lb

## 2024-05-21 DIAGNOSIS — K219 Gastro-esophageal reflux disease without esophagitis: Secondary | ICD-10-CM

## 2024-05-21 DIAGNOSIS — R1310 Dysphagia, unspecified: Secondary | ICD-10-CM | POA: Diagnosis not present

## 2024-05-21 DIAGNOSIS — Z860101 Personal history of adenomatous and serrated colon polyps: Secondary | ICD-10-CM

## 2024-05-21 DIAGNOSIS — K648 Other hemorrhoids: Secondary | ICD-10-CM

## 2024-05-21 MED ORDER — FAMOTIDINE 40 MG PO TABS: 40.0000 mg | ORAL_TABLET | Freq: Two times a day (BID) | ORAL | 3 refills | Status: DC

## 2024-05-21 NOTE — Patient Instructions (Addendum)
 We have sent the following medications to your pharmacy for you to pick up at your convenience: Famotidine  40 mg twice daily   Diagnoses Codes:  GERD K21.9 Dysphagia R13.10 Internal Hemorrhoids K64.8   You have been scheduled for an Upper GI Series at [facility] (1st floor). Your appointment is on 06/03/24 at 10:00 am. Please arrive 30 minutes prior to your test for registration. Make sure not to eat or drink anything after midnight on the night before your test. If you need to reschedule, please call radiology at [phone]. ________________________________________________________________ An upper GI series uses x rays to help diagnose problems of the upper GI tract, which includes the esophagus, stomach, and duodenum. The duodenum is the first part of the small intestine. An upper GI series is conducted by a radiology technologist or a radiologist--a doctor who specializes in x-ray imaging--at a hospital or outpatient center. While sitting or standing in front of an x-ray machine, the patient drinks barium liquid, which is often white and has a chalky consistency and taste. The barium liquid coats the lining of the upper GI tract and makes signs of disease show up more clearly on x rays. X-ray video, called fluoroscopy, is used to view the barium liquid moving through the esophagus, stomach, and duodenum. Additional x rays and fluoroscopy are performed while the patient lies on an x-ray table. To fully coat the upper GI tract with barium liquid, the technologist or radiologist may press on the abdomen or ask the patient to change position. Patients hold still in various positions, allowing the technologist or radiologist to take x rays of the upper GI tract at different angles. If a technologist conducts the upper GI series, a radiologist will later examine the images to look for problems.  This test typically takes about 1 hour to  complete. __________________________________________________________________  Rosine have been scheduled for an Endoscopy. Please follow written instructions given to you at your visit today.  If you use inhalers (even only as needed), please bring them with you on the day of your procedure.  If you take any of the following medications, they will need to be adjusted prior to your procedure:   DO NOT TAKE 7 DAYS PRIOR TO TEST- Trulicity (dulaglutide) Ozempic, Wegovy (semaglutide) Mounjaro (tirzepatide) Bydureon Bcise (exanatide extended release)  DO NOT TAKE 1 DAY PRIOR TO YOUR TEST Rybelsus (semaglutide) Adlyxin (lixisenatide) Victoza (liraglutide) Byetta (exanatide) ___________________________________________________________________________  Please follow up sooner if symptoms increase or worsen  Due to recent changes in healthcare laws, you may see the results of your imaging and laboratory studies on MyChart before your provider has had a chance to review them.  We understand that in some cases there may be results that are confusing or concerning to you. Not all laboratory results come back in the same time frame and the provider may be waiting for multiple results in order to interpret others.  Please give us  48 hours in order for your provider to thoroughly review all the results before contacting the office for clarification of your results.   Thank you for trusting me with your gastrointestinal care!   Ellouise Console, PA-C _______________________________________________________  If your blood pressure at your visit was 140/90 or greater, please contact your primary care physician to follow up on this.  _______________________________________________________  If you are age 58 or older, your body mass index should be between 23-30. Your Body mass index is 29.61 kg/m. If this is out of the aforementioned range listed, please consider  follow up with your Primary Care  Provider.  If you are age 81 or younger, your body mass index should be between 19-25. Your Body mass index is 29.61 kg/m. If this is out of the aformentioned range listed, please consider follow up with your Primary Care Provider.   ________________________________________________________  The [place] [facility] providers would like to encourage you to use MYCHART to communicate with providers for non-urgent requests or questions.  Due to long hold times on the telephone, sending your provider a message by [facility] may be a faster and more efficient way to get a response.  Please allow 48 business hours for a response.  Please remember that this is for non-urgent requests.  _______________________________________________________

## 2024-05-21 NOTE — Progress Notes (Signed)
 Pt aware of recommendations per Dr Leigh. Xray cancelled and pt aware, she knows to keep appts as scheduled. Made f/u appt for pt with Dr. Leigh for 07/29/24@9 :50am.

## 2024-05-21 NOTE — Progress Notes (Signed)
 Agree with assessment and plan as outlined. If hemorrhoids are causing symptoms despite medical therapy, agree with hemorrhoid banding. If she is having poorly controlled GERD and wishes to avoid PPI, agree with EGD to further evaluate and assess her candidacy for TIF. She had an exam in 2021, but needs to be repeated if considering TIF and evaluate in light of her dysphagia. Her BMI is < 35, so if her endoscopy shows no significant hiatal hernia or laxity at the GEJ, TIF may be reasonable option depending on how aggresssive she wishes to be with management.   Vicki Dixon if she is going to do an EGD, we do not need to do upper GI series, would hold off on that for now. Thanks

## 2024-05-22 ENCOUNTER — Telehealth: Payer: Self-pay | Admitting: Physician Assistant

## 2024-05-22 NOTE — Telephone Encounter (Signed)
 Inbound call from patient requesting for a call back in regards to her getting procedure codes for her hemorrhoid banding and EGD. Please advise.

## 2024-05-26 ENCOUNTER — Ambulatory Visit (INDEPENDENT_AMBULATORY_CARE_PROVIDER_SITE_OTHER): Admitting: Gastroenterology

## 2024-05-26 ENCOUNTER — Encounter: Payer: Self-pay | Admitting: Gastroenterology

## 2024-05-26 VITALS — BP 112/72 | HR 68 | Ht 60.0 in | Wt 149.5 lb

## 2024-05-26 DIAGNOSIS — K641 Second degree hemorrhoids: Secondary | ICD-10-CM | POA: Diagnosis not present

## 2024-05-26 DIAGNOSIS — K219 Gastro-esophageal reflux disease without esophagitis: Secondary | ICD-10-CM

## 2024-05-26 MED ORDER — OMEPRAZOLE 20 MG PO CPDR: 20.0000 mg | DELAYED_RELEASE_CAPSULE | Freq: Every day | ORAL | 3 refills | Status: DC

## 2024-05-26 NOTE — Patient Instructions (Signed)
 We have sent the following medications to your pharmacy for you to pick up at your convenience: Omeprazole  20 mg: Take once daily   HEMORRHOID BANDING PROCEDURE    FOLLOW-UP CARE   The procedure you have had should have been relatively painless since the banding of the area involved does not have nerve endings and there is no pain sensation.  The rubber band cuts off the blood supply to the hemorrhoid and the band may fall off as soon as 48 hours after the banding (the band may occasionally be seen in the toilet bowl following a bowel movement). You may notice a temporary feeling of fullness in the rectum which should respond adequately to plain Tylenol or Motrin.  Following the banding, avoid strenuous exercise that evening and resume full activity the next day.  A sitz bath (soaking in a warm tub) or bidet is soothing, and can be useful for cleansing the area after bowel movements.     To avoid constipation, take two tablespoons of natural wheat bran, natural oat bran, flax, Benefiber or any over the counter fiber supplement and increase your water intake to 7-8 glasses daily.    Unless you have been prescribed anorectal medication, do not put anything inside your rectum for two weeks: No suppositories, enemas, fingers, etc.  Occasionally, you may have more bleeding than usual after the banding procedure.  This is often from the untreated hemorrhoids rather than the treated one.  Don't be concerned if there is a tablespoon or so of blood.  If there is more blood than this, lie flat with your bottom higher than your head and apply an ice pack to the area. If the bleeding does not stop within a half an hour or if you feel faint, call our office at [phone] or go to the emergency room.  Problems are not common; however, if there is a substantial amount of bleeding, severe pain, chills, fever or difficulty passing urine (very rare) or other problems, you should call us  at [phone]  or report to the nearest emergency room.  Do not stay seated continuously for more than 2-3 hours for a day or two after the procedure.  Tighten your buttock muscles 10-15 times every two hours and take 10-15 deep breaths every 1-2 hours.  Do not spend more than a few minutes on the toilet if you cannot empty your bowel; instead re-visit the toilet at a later time.    You have been scheduled for a 2nd banding appointment on 08-12-24 at 11:00am. Please arrive 10 minutes early for registration. If you need to reschedule or cancel this appointment please call [phone] as soon as possible. Thank you.  Thank you for entrusting me with your care and for choosing [facility], Dr. Elspeth Naval    _______________________________________________________  If your blood pressure at your visit was 140/90 or greater, please contact your primary care physician to follow up on this.  _______________________________________________________  If you are age 19 or older, your body mass index should be between 23-30. Your Body mass index is 29.2 kg/m. If this is out of the aforementioned range listed, please consider follow up with your Primary Care Provider.  If you are age 33 or younger, your body mass index should be between 19-25. Your Body mass index is 29.2 kg/m. If this is out of the aformentioned range listed, please consider follow up with your Primary Care Provider.   ________________________________________________________  The [place] [facility] providers would like  to encourage you to use MYCHART to communicate with providers for non-urgent requests or questions.  Due to long hold times on the telephone, sending your provider a message by [facility] may be a faster and more efficient way to get a response.  Please allow 48 business hours for a response.  Please remember that this is for non-urgent requests.  _______________________________________________________  [facility]  is using a team-based approach to care.  Your team is made up of your doctor and two to three APPS. Our APPS (Nurse Practitioners and Physician Assistants) work with your physician to ensure care continuity for you. They are fully qualified to address your health concerns and develop a treatment plan. They communicate directly with your gastroenterologist to care for you. Seeing the Advanced Practice Practitioners on your physician's team can help you by facilitating care more promptly, often allowing for earlier appointments, access to diagnostic testing, procedures, and other specialty referrals.

## 2024-05-26 NOTE — Progress Notes (Signed)
 HPI :  53 y.o. female here for follow-up to discuss management of internal hemorrhoids and GERD.  She reports she has had hemorrhoids for many years, dating back to birth of her child.  She has intermittent discomfort in her rectum from the hemorrhoids as well as grade 2 prolapse.  Denies much of any bleeding symptoms.  Denies straining with her bowels or constipation.  She has been lifting more weights recently/exercising in the gym and thinks her hemorrhoids have gotten worse during this time.  She is interested in pursuing hemorrhoid banding.  Her colonoscopy is up-to-date which showed some hemorrhoids at the time, as outlined below.  We discussed hemorrhoids and treatment options, offered her banding, discussed what it entails at length and she wanted to proceed following discussion of risks and benefits.  Otherwise recall she has a history of GERD.  She has symptoms of nocturnal regurgitation and pyrosis that bothers her most, often interfering with her sleep.  She also has a lot of belching with this.  She has been on omeprazole  in the past which helped however she has concerns about her bone health and really wants to avoid taking PPIs.  Her father had severe osteoporosis and bone fractures.  The patient personally states she has osteopenia and not osteoporosis.  She is currently taking Pepcid , states it does not really help her when she takes it and largely remains symptomatic.  She denies any dysphagia currently.  She is scheduled for an EGD with me to reassess anatomy, assess candidacy for TIF.  We discussed what that procedure entails and long-term options    07/2020 colonoscopy: 2 small sessile serrated polyps removed.  Spastic colon.  Small internal hemorrhoids.  Good prep.  5-year repeat (due 07/2025).   07/2020 EGD: Mild gastritis, otherwise normal.  No Barrett's.  Biopsies negative for H. pylori.    Past Medical History:  Diagnosis Date   Anxiety    Colon polyps    GERD  (gastroesophageal reflux disease)    Hemorrhoids    History of migraine headaches    Insomnia    UTI (urinary tract infection)      Past Surgical History:  Procedure Laterality Date   PARTIAL HYSTERECTOMY     Family History  Problem Relation Age of Onset   Hyperlipidemia Father    Deep vein thrombosis Father    Lupus Sister    Osteopenia Sister    Cancer Maternal Grandmother    Cancer Maternal Grandfather    Colon cancer Neg Hx    Pancreatic cancer Neg Hx    Rectal cancer Neg Hx    Esophageal cancer Neg Hx    Social History   Tobacco Use   Smoking status: Never   Smokeless tobacco: Never  Vaping Use   Vaping status: Never Used  Substance Use Topics   Alcohol use: Never   Drug use: Never   Current Outpatient Medications  Medication Sig Dispense Refill   famotidine  (PEPCID ) 40 MG tablet Take 1 tablet (40 mg total) by mouth 2 (two) times daily. 180 tablet 3   hydrocortisone  (ANUSOL -HC) 25 MG suppository Place 1 suppository (25 mg total) rectally at bedtime. 12 suppository 1   Multiple Vitamin (MULTI-VITAMIN) tablet Take 1 tablet by mouth daily.     omeprazole  (PRILOSEC) 20 MG capsule Take 1 capsule (20 mg total) by mouth daily. 30 capsule 3   No current facility-administered medications for this visit.   No Known Allergies   Review of Systems: All systems  reviewed and negative except where noted in HPI.    Physical Exam: BP 112/72   Pulse 68   Ht 5' (1.524 m)   Wt 149 lb 8 oz (67.8 kg)   SpO2 99%   BMI 29.20 kg/m  Constitutional: Pleasant,well-developed, female in no acute distress. DRE - see procedure note below Neurological: Alert and oriented to person place and time. Psychiatric: Normal mood and affect. Behavior is normal.   ASSESSMENT: 53 y.o. female here for assessment of the following  1. Grade II hemorrhoids   2. Gastroesophageal reflux disease, unspecified whether esophagitis present    Discussed long-term management of hemorrhoids.  DRE  and anoscopy performed as below, I think she is a good candidate for hemorrhoid banding.  We discussed all treatment options, offered her hemorrhoid banding, following discussion of risks and benefits she wanted to proceed and left lateral hemorrhoid banded as outlined below.  She should try to avoid any strenuous lifting/weight lifting over the next 2 weeks to allow healing and minimize symptoms.  We otherwise discussed GERD and management of this at length.  She has osteopenia, her family members have had osteoporosis and complications from that and is quite concerned about her fracture risk.  On the other hand, she is quite symptomatic from reflux and clearly failing Pepcid .  We discussed long-term options to include PPIs, Voquenza, and intervention such as TIF or surgery.  We did discuss that long-term, PPIs are associated with increased risk for bone fracture, would like to avoid them or use lowest dose needed to reduce that risk of possible.  That being said, low-dose of PPI in the setting of osteopenia should not be too high risk for fracture and I think benefits of the regimen probably outweigh the risks based on how symptomatic she is.  She agrees to try low-dose omeprazole  at 20 mg daily, see how much control this gets her and if she requires escalation of dosing or this can be managed on low-dose PPI.  She is scheduled for an upper endoscopy with me in a few weeks to reassess her anatomy and assess her candidacy for TIF.  If she meets endoscopic criteria for TIF, I think she might be a very good candidate if she wanted long-term management of reflux without having to take PPI.  We will await that exam.  Otherwise her weight seems stable and appropriate, no recent weight gain.  Will await her course on low-dose PPI and await EGD with further recommendations   PLAN: - banded LL hemorrhoid as below - discussed reflux options - risks / benefits of chronic PPI use. She has osteopenia, but quite  symptomatic and failing pepcid  - start omeprazole  20mg  / day for 30 day trial - EGD in a few weeks - consideration for TIF pending findings  I spent 35 minutes of time, including in depth chart review, face-to-face time with the patient, and documentation.   Marcey Naval, MD [facility]     PROCEDURE NOTE: The patient presents with symptomatic grade II  hemorrhoids, requesting rubber band ligation of his/her hemorrhoidal disease.  All risks, benefits and alternative forms of therapy were described and informed consent was obtained.  CMA Madison Favre as standby. In the Left Lateral Decubitus position anoscopic examination revealed grade II hemorrhoids in all position(s).  The anorectum was pre-medicated with 0.125% nitroglycerin The decision was made to band the LL internal hemorrhoid, and the [facility] O'Regan System was used to perform band ligation without complication.  Digital anorectal examination  was then performed to assure proper positioning of the band, and to adjust the banded tissue as required.  The patient was discharged home without pain or other issues.  Dietary and behavioral recommendations were given and along with follow-up instructions.     The patient will return in 2-4 weeks for  follow-up and possible additional banding as required. No complications were encountered and the patient tolerated the procedure well.

## 2024-05-28 ENCOUNTER — Telehealth: Payer: Self-pay | Admitting: Gastroenterology

## 2024-05-28 ENCOUNTER — Other Ambulatory Visit: Payer: Self-pay

## 2024-05-28 MED ORDER — OMEPRAZOLE 20 MG PO CPDR: 20.0000 mg | DELAYED_RELEASE_CAPSULE | Freq: Every day | ORAL | 3 refills | Status: DC

## 2024-05-28 NOTE — Telephone Encounter (Signed)
 Patient requesting f/u call in regards to a medication that is suppose to be prescribed to her. Patient could not disclose medication name. Please advise.

## 2024-05-28 NOTE — Telephone Encounter (Signed)
 Omeprazole  went to the wrong pharmacy.  Resent to [employer].

## 2024-06-03 ENCOUNTER — Other Ambulatory Visit (HOSPITAL_COMMUNITY)

## 2024-06-16 ENCOUNTER — Encounter: Payer: Self-pay | Admitting: Gastroenterology

## 2024-06-16 ENCOUNTER — Ambulatory Visit: Admitting: Gastroenterology

## 2024-06-16 VITALS — BP 97/64 | HR 62 | Temp 97.9°F | Resp 16 | Ht 60.0 in | Wt 151.0 lb

## 2024-06-16 DIAGNOSIS — K219 Gastro-esophageal reflux disease without esophagitis: Secondary | ICD-10-CM

## 2024-06-16 DIAGNOSIS — K3189 Other diseases of stomach and duodenum: Secondary | ICD-10-CM

## 2024-06-16 DIAGNOSIS — K2950 Unspecified chronic gastritis without bleeding: Secondary | ICD-10-CM | POA: Diagnosis not present

## 2024-06-16 MED ORDER — SODIUM CHLORIDE 0.9 % IV SOLN: 500.0000 mL | INTRAVENOUS | Status: DC

## 2024-06-16 NOTE — Op Note (Signed)
 [facility] Patient Name: Vicki Dixon Procedure Date: 06/16/2024 9:40 AM MRN: [id number] Endoscopist: Elspeth P. Leigh , MD, [id number] Age: 53 Referring MD:  Date of Birth: 1971/07/08 Gender: Female Account #: [id number] Procedure:                Upper GI endoscopy Indications:              Suspected gastro-esophageal reflux disease - failed                            pepcid , has osteopenia - worried about long term                            risks of chronic PPI, omeprazole  has helped in the                            past. EGD to screen for BE, and to see if a                            candidate for TIF if she wishes to pursue that in                            favor of medical therapy Medicines:                Monitored Anesthesia Care Procedure:                Pre-Anesthesia Assessment:                           - Prior to the procedure, a History and Physical                            was performed, and patient medications and                            allergies were reviewed. The patient's tolerance of                            previous anesthesia was also reviewed. The risks                            and benefits of the procedure and the sedation                            options and risks were discussed with the patient.                            All questions were answered, and informed consent                            was obtained. Prior Anticoagulants: The patient has                            taken no anticoagulant or antiplatelet agents. ASA  Grade Assessment: II - A patient with mild systemic                            disease. After reviewing the risks and benefits,                            the patient was deemed in satisfactory condition to                            undergo the procedure.                           After obtaining informed consent, the endoscope was                            passed under direct  vision. Throughout the                            procedure, the patient's blood pressure, pulse, and                            oxygen saturations were monitored continuously. The                            Olympus Scope [id number] was introduced through the                            mouth, and advanced to the second part of duodenum.                            The upper GI endoscopy was accomplished without                            difficulty. The patient tolerated the procedure                            well. Scope In: Scope Out: Findings:                 Esophagogastric landmarks were identified: the                            Z-line was found at 37 cm, the gastroesophageal                            junction was found at 37 cm and the upper extent of                            the gastric folds was found at 37 cm from the                            incisors.                           The gastroesophageal flap  valve was visualized                            endoscopically and classified as Hill Grade I                            (prominent fold, tight to endoscope).                           The exam of the esophagus was otherwise normal. No                            Barrett's.                           Biopsies were obtained from the proximal and distal                            esophagus with cold forceps for histology to ensure                            no eosinophilic esophagitis and to assess for                            objective evidence of reflux changes if considering                            TIF.                           Patchy mildly erythematous mucosa was found in the                            gastric fundus and in the gastric body. Biopsies                            were taken with a cold forceps for Helicobacter                            pylori testing.                           The exam of the stomach was otherwise normal.                           The  examined duodenum was normal. Complications:            No immediate complications. Estimated blood loss:                            Minimal. Estimated Blood Loss:     Estimated blood loss was minimal. Impression:               - Esophagogastric landmarks identified.                           -  Gastroesophageal flap valve classified as Hill                            Grade I (prominent fold, tight to endoscope).                           - Normal esophagus otherwise - biopsies taken to                            assess for objective evidence of reflux if                            considering TIF in the future, also rule out EoE.                           - Erythematous mucosa in the gastric fundus and                            gastric body. Biopsied.                           - Normal stomach otherwise.                           - Normal examined duodenum. Recommendation:           - Patient has a contact number available for                            emergencies. The signs and symptoms of potential                            delayed complications were discussed with the                            patient. Return to normal activities tomorrow.                            Written discharge instructions were provided to the                            patient.                           - Resume previous diet.                           - Continue present medications.                           - Take omeprazole  daily for 4 weeks trial, assess                            response                           - Await  pathology results.                           - Patient is a candidate for TIF if she wishes to                            manage this and avoid medical therapy (risks of                            PPI, bone health concerns), will discuss with her Elspeth SQUIBB. Leigh, MD 06/16/2024 10:05:22 AM This report has been signed electronically.

## 2024-06-16 NOTE — Progress Notes (Signed)
 To pacu, VSS. Report to [email or web address]

## 2024-06-16 NOTE — Progress Notes (Signed)
 Pt's states no medical or surgical changes since previsit or office visit.

## 2024-06-16 NOTE — Patient Instructions (Addendum)
 Resume previous diet.  Continue present medications.  Await pathology results.   Take Omeprazole  daily for 4 weeks trial, assess response.   YOU HAD AN ENDOSCOPIC PROCEDURE TODAY AT THE [facility]:   Refer to the procedure report that was given to you for any specific questions about what was found during the examination.  If the procedure report does not answer your questions, please call your gastroenterologist to clarify.  If you requested that your care partner not be given the details of your procedure findings, then the procedure report has been included in a sealed envelope for you to review at your convenience later.  YOU SHOULD EXPECT: Some feelings of bloating in the abdomen. Passage of more gas than usual.  Walking can help get rid of the air that was put into your GI tract during the procedure and reduce the bloating. If you had a lower endoscopy (such as a colonoscopy or flexible sigmoidoscopy) you may notice spotting of blood in your stool or on the toilet paper. If you underwent a bowel prep for your procedure, you may not have a normal bowel movement for a few days.  Please Note:  You might notice some irritation and congestion in your nose or some drainage.  This is from the oxygen used during your procedure.  There is no need for concern and it should clear up in a day or so.  SYMPTOMS TO REPORT IMMEDIATELY:  Following upper endoscopy (EGD)  Vomiting of blood or coffee ground material  New chest pain or pain under the shoulder blades  Painful or persistently difficult swallowing  New shortness of breath  Fever of 100F or higher  Black, tarry-looking stools  For urgent or emergent issues, a gastroenterologist can be reached at any hour by calling [phone]. Do not use MyChart messaging for urgent concerns.    DIET:  We do recommend a small meal at first, but then you may proceed to your regular diet.  Drink plenty of fluids but you should avoid alcoholic  beverages for 24 hours.  ACTIVITY:  You should plan to take it easy for the rest of today and you should NOT DRIVE or use heavy machinery until tomorrow (because of the sedation medicines used during the test).    FOLLOW UP: Our staff will call the number listed on your records the next business day following your procedure.  We will call around 7:15- 8:00 am to check on you and address any questions or concerns that you may have regarding the information given to you following your procedure. If we do not reach you, we will leave a message.     If any biopsies were taken you will be contacted by phone or by letter within the next 1-3 weeks.  Please call us  at [phone] if you have not heard about the biopsies in 3 weeks.    SIGNATURES/CONFIDENTIALITY: You and/or your care partner have signed paperwork which will be entered into your electronic medical record.  These signatures attest to the fact that that the information above on your After Visit Summary has been reviewed and is understood.  Full responsibility of the confidentiality of this discharge information lies with you and/or your care-partner.

## 2024-06-16 NOTE — Progress Notes (Signed)
 [facility] History and Physical   Primary Care Physician:  Loreli Elsie JONETTA Mickey., MD   Reason for Procedure:   GERD  Plan:    EGD     HPI: ARTRICE KRAKER is a 53 y.o. female  here for EGD to evaluate GERD. Has failed pepcid , has osteopenia and concerned about long term bone health on PPI. Has started omeprazole  20mg  / day. First EGD, assess candidacy for TIF if she wants to avoid medical therapy. Has only taken a few doses of omeprazole  but said it did help when she took it.   Otherwise feels well without any cardiopulmonary symptoms.   I have discussed risks / benefits of anesthesia and endoscopic procedure with Kaitrin M [place] and they wish to proceed with the exams as outlined today.    Past Medical History:  Diagnosis Date   Anxiety    Colon polyps    GERD (gastroesophageal reflux disease)    Hemorrhoids    History of migraine headaches    Insomnia    UTI (urinary tract infection)     Past Surgical History:  Procedure Laterality Date   PARTIAL HYSTERECTOMY      Prior to Admission medications   Medication Sig Start Date End Date Taking? Authorizing Provider  Cholecalciferol (VITAMIN D3) 50 MCG (2000 UT) CAPS 2,000 Units.   Yes [provider]  Multiple Vitamin (MULTI-VITAMIN) tablet Take 1 tablet by mouth daily.   Yes [provider]  famotidine  (PEPCID ) 40 MG tablet Take 1 tablet (40 mg total) by mouth 2 (two) times daily. 05/21/24 05/16/25  [place], PA-C  hydrocortisone  (ANUSOL -HC) 25 MG suppository Place 1 suppository (25 mg total) rectally at bedtime. 02/24/24   [place], PA-C  omeprazole  (PRILOSEC) 20 MG capsule Take 1 capsule (20 mg total) by mouth daily. 05/28/24   Leigh Elspeth SQUIBB, MD    Current Outpatient Medications  Medication Sig Dispense Refill   Cholecalciferol (VITAMIN D3) 50 MCG (2000 UT) CAPS 2,000 Units.     Multiple Vitamin (MULTI-VITAMIN) tablet Take 1 tablet by mouth daily.     famotidine   (PEPCID ) 40 MG tablet Take 1 tablet (40 mg total) by mouth 2 (two) times daily. 180 tablet 3   hydrocortisone  (ANUSOL -HC) 25 MG suppository Place 1 suppository (25 mg total) rectally at bedtime. 12 suppository 1   omeprazole  (PRILOSEC) 20 MG capsule Take 1 capsule (20 mg total) by mouth daily. 30 capsule 3   Current Facility-Administered Medications  Medication Dose Route Frequency Provider Last Rate Last Admin   0.9 %  sodium chloride  infusion  500 mL Intravenous Continuous Icelyn Navarrete, Elspeth SQUIBB, MD        Allergies as of 06/16/2024   (No Known Allergies)    Family History  Problem Relation Age of Onset   Hyperlipidemia Father    Deep vein thrombosis Father    Lupus Sister    Osteopenia Sister    Cancer Maternal Grandmother    Cancer Maternal Grandfather    Colon cancer Neg Hx    Pancreatic cancer Neg Hx    Rectal cancer Neg Hx    Esophageal cancer Neg Hx     Social History   Socioeconomic History   Marital status: Married    Spouse name: Not on file   Number of children: 2   Years of education: Not on file   Highest education level: Not on file  Occupational History   Occupation: Attorney    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  Tobacco  Use   Smoking status: Never   Smokeless tobacco: Never  Vaping Use   Vaping status: Never Used  Substance and Sexual Activity   Alcohol use: Never   Drug use: Never   Sexual activity: Not on file  Other Topics Concern   Not on file  Social History Narrative   Not on file   Social Drivers of Health   Financial Resource Strain: Not on file  Food Insecurity: Not on file  Transportation Needs: Not on file  Physical Activity: Not on file  Stress: Not on file  Social Connections: Not on file  Intimate Partner Violence: Not on file    Review of Systems: All other review of systems negative except as mentioned in the HPI.  Physical Exam: Vital signs BP (!) 84/56 Comment: left arm  Pulse 62   Temp 97.9 F (36.6 C)   Ht  5' (1.524 m)   Wt 151 lb (68.5 kg)   SpO2 97%   BMI 29.49 kg/m   General:   Alert,  Well-developed, pleasant and cooperative in NAD Lungs:  Clear throughout to auscultation.   Heart:  Regular rate and rhythm Abdomen:  Soft, nontender and nondistended.   Neuro/Psych:  Alert and cooperative. Normal mood and affect. A and O x 3  Marcey Naval, MD [facility]

## 2024-06-17 ENCOUNTER — Telehealth: Payer: Self-pay | Admitting: *Deleted

## 2024-06-17 NOTE — Telephone Encounter (Signed)
  Follow up Call-     06/16/2024    9:17 AM  Call back number  Post procedure Call Back phone  # [phone]  Permission to leave phone message Yes     Patient questions:  Do you have a fever, pain , or abdominal swelling? No. Pain Score  0 *  Have you tolerated food without any problems? Yes.    Have you been able to return to your normal activities? Yes.    Do you have any questions about your discharge instructions: Diet   No. Medications  No. Follow up visit  No.  Do you have questions or concerns about your Care? No.  Actions: * If pain score is 4 or above: No action needed, pain <4.

## 2024-06-19 LAB — SURGICAL PATHOLOGY

## 2024-06-22 ENCOUNTER — Ambulatory Visit: Payer: Self-pay | Admitting: Gastroenterology

## 2024-06-25 ENCOUNTER — Encounter: Payer: Self-pay | Admitting: Gastroenterology

## 2024-07-22 ENCOUNTER — Telehealth: Payer: Self-pay | Admitting: Gastroenterology

## 2024-07-22 NOTE — Telephone Encounter (Signed)
 Called and left message for pt apologizing as I scheduled her with Dr. Stacia and she is an [facility] patient. Explained that we do not usually switch providers once they have established. Scheduled her for banding with Dr. Leigh 12/*26/25 at 2:30pm. Let her know both appts will remain on the schedule until we hear back from her as to which appt she would like to keep. Will await further communication from pt.

## 2024-07-22 NOTE — Telephone Encounter (Signed)
 Patient was originally scheduled with Dr. Stacia on 12/5 by [facility]. Patient called a few days later and requested a sooner appointment due to leaving town on 12/5. Patient was rescheduled with Dr. Stacia on 12/2. Received a message from [place] stating patient needs to be rescheduled since she is a Dr. Hassan patient. Contacted patient to advise that she would need to be rescheduled. Patient was then highly frustrated that her appointment would have to be changed to January after being cancelled for 12/3 with Dr. Leigh. Patient then proceeded to be very frustrated with me. Checked with Rock to see if patient could keep 12/2 appointment since she was scheduled originally on 12/5. Patient stated she does not wish to wait to be seen in January due to insurance changing and she is not sure how it will look. Please advise, thank you.

## 2024-07-28 ENCOUNTER — Ambulatory Visit: Admitting: Gastroenterology

## 2024-07-29 ENCOUNTER — Ambulatory Visit: Admitting: Gastroenterology

## 2024-07-29 ENCOUNTER — Encounter: Admitting: Gastroenterology

## 2024-07-29 NOTE — Telephone Encounter (Signed)
 Letter sent to pt regarding appts via mychart.

## 2024-07-31 ENCOUNTER — Ambulatory Visit: Admitting: Gastroenterology

## 2024-08-12 ENCOUNTER — Encounter: Admitting: Gastroenterology

## 2024-08-17 ENCOUNTER — Ambulatory Visit: Admitting: Gastroenterology

## 2024-08-21 ENCOUNTER — Encounter: Admitting: Gastroenterology

## 2024-08-21 NOTE — Progress Notes (Deleted)
 53 y.o. female here for follow-up to discuss management of internal hemorrhoids and GERD.   She reports she has had hemorrhoids for many years, dating back to birth of her child.  She has intermittent discomfort in her rectum from the hemorrhoids as well as grade 2 prolapse.  Denies much of any bleeding symptoms.  Denies straining with her bowels or constipation.  She has been lifting more weights recently/exercising in the gym and thinks her hemorrhoids have gotten worse during this time.  She is interested in pursuing hemorrhoid banding.  Her colonoscopy is up-to-date which showed some hemorrhoids at the time, as outlined below.  We discussed hemorrhoids and treatment options, offered her banding, discussed what it entails at length and she wanted to proceed following discussion of risks and benefits.   Otherwise recall she has a history of GERD.  She has symptoms of nocturnal regurgitation and pyrosis that bothers her most, often interfering with her sleep.  She also has a lot of belching with this.  She has been on omeprazole  in the past which helped however she has concerns about her bone health and really wants to avoid taking PPIs.  Her father had severe osteoporosis and bone fractures.  The patient personally states she has osteopenia and not osteoporosis.  She is currently taking Pepcid , states it does not really help her when she takes it and largely remains symptomatic.  She denies any dysphagia currently.  She is scheduled for an EGD with me to reassess anatomy, assess candidacy for TIF.  We discussed what that procedure entails and long-term options     07/2020 colonoscopy: 2 small sessile serrated polyps removed.  Spastic colon.  Small internal hemorrhoids.  Good prep.  5-year repeat (due 07/2025).   07/2020 EGD: Mild gastritis, otherwise normal.  No Barrett's.  Biopsies negative for H. pylori.   53 y.o. female here for assessment of the following   1. Grade II hemorrhoids   2.  Gastroesophageal reflux disease, unspecified whether esophagitis present     Discussed long-term management of hemorrhoids.  DRE and anoscopy performed as below, I think she is a good candidate for hemorrhoid banding.  We discussed all treatment options, offered her hemorrhoid banding, following discussion of risks and benefits she wanted to proceed and left lateral hemorrhoid banded as outlined below.  She should try to avoid any strenuous lifting/weight lifting over the next 2 weeks to allow healing and minimize symptoms.   We otherwise discussed GERD and management of this at length.  She has osteopenia, her family members have had osteoporosis and complications from that and is quite concerned about her fracture risk.  On the other hand, she is quite symptomatic from reflux and clearly failing Pepcid .  We discussed long-term options to include PPIs, Voquenza, and intervention such as TIF or surgery.  We did discuss that long-term, PPIs are associated with increased risk for bone fracture, would like to avoid them or use lowest dose needed to reduce that risk of possible.  That being said, low-dose of PPI in the setting of osteopenia should not be too high risk for fracture and I think benefits of the regimen probably outweigh the risks based on how symptomatic she is.  She agrees to try low-dose omeprazole  at 20 mg daily, see how much control this gets her and if she requires escalation of dosing or this can be managed on low-dose PPI.  She is scheduled for an upper endoscopy with me in a few weeks to  reassess her anatomy and assess her candidacy for TIF.  If she meets endoscopic criteria for TIF, I think she might be a very good candidate if she wanted long-term management of reflux without having to take PPI.  We will await that exam.  Otherwise her weight seems stable and appropriate, no recent weight gain.  Will await her course on low-dose PPI and await EGD with further recommendations     PLAN: -  banded LL hemorrhoid as below - discussed reflux options - risks / benefits of chronic PPI use. She has osteopenia, but quite symptomatic and failing pepcid  - start omeprazole  20mg  / day for 30 day trial - EGD in a few weeks - consideration for TIF pending findings    This message is to relay the results of your recent endoscopy biopsies.  Biopsies of your stomach shows some inflammation but there is no evidence of any infection to be causing this (H. pylori negative).  Biopsies of your stomach otherwise appear okay without any concerning findings.   Moving forward, in relation to your reflux symptoms we had recommended that you do a trial of omeprazole  daily for 4 weeks to assess response.  If you find this works well for you to control your symptoms, you can use the lowest dose needed to control symptoms over time.  If, alternatively, you find that this works for you but you do not want to take medication long-term to treat reflux, you would be a candidate for the endoscopic reflux procedure called TIF.  We would need to discuss this further in the office if that is something you wish to pursue.  Please keep me posted on your status and we can certainly get you back in the office to discuss options further, based on your response to omeprazole .  Let me know if you have any questions about this otherwise

## 2024-09-09 NOTE — Progress Notes (Signed)
 "   HPI :  54 year old female here for follow-up visit, primarily to discuss GERD, and her history of hemorrhoids.  Recall she has had longstanding reflux for many years.  Main symptoms are pyrosis and regurgitation, often bother her at night when she lies down to sleep, which can certainly affect her sleep quality.  She also has occasional symptoms during the daytime and rare belching.  No nausea, no vomiting, eats well.  Generally does not have any dysphagia, perhaps can rarely have this on occasion.  Recall she tried regular dosing of Pepcid  in the past which has not really worked too well.  Historically she has really wanted to avoid PPIs as she is aware of the increased risk for bone fracture with long-term PPI use.  Her father had severe osteoporosis and numerous bone fractures at the end of his life, her sister has problems with her bones and the patient has osteopenia.  She really wants to avoid long-term PPI if possible.  We did an EGD for her October 21 last year.  No evidence of Barrett's esophagus, no erosive changes.  No hiatal hernia.  Hill grade 1.  Biopsies of her esophagus showed no abnormalities or EOE.  I recommended short-term trial of low-dose omeprazole  to see how much that would help her.  She took 20 mg once daily for 30 days.  She states it did help with her pyrosis, however she felt that her belching increased on it.  Regardless she was not going to take it long-term.  She has really focused on lifestyle changes to try to minimize symptoms.  She has lost 20 pounds, changed her diet significantly, exercises routinely.  She states despite these changes her reflux has not significantly improved and she inquires about other options.  Otherwise recall she has had hemorrhoids for many years, dating back to birth of her child, she has intermittent discomfort, grade 2 prolapse.  Her colonoscopy was done in 2021.  I offered her hemorrhoid banding which she had done last September, left  lateral hemorrhoid banded.  She tolerated the banding without problems.  She states her hemorrhoid symptoms are intermittent and has continued to occur.  While she tolerated the hemorrhoid banding well, she is not sure if she noticed much benefit.  She was actually scheduled for repeat hemorrhoid banding today but states she is not sure she wants to proceed with that, she states generally she has not had any problems recently.  She is due for surveillance colonoscopy at the end of the year  EGD 06/16/24: - Esophagogastric landmarks were identified: the Z-line was found at 37 cm, the gastroesophageal junction was found at 37 cm and the upper extent of the gastric folds was found at 37 cm from the incisors. Findings: - The gastroesophageal flap valve was visualized endoscopically and classified as Hill Grade I (prominent fold, tight to endoscope). - The exam of the esophagus was otherwise normal. No Barrett's. - Biopsies were obtained from the proximal and distal esophagus with cold forceps for histology to ensure no eosinophilic esophagitis and to assess for objective evidence of reflux changes if considering TIF. - Patchy mildly erythematous mucosa was found in the gastric fundus and in the gastric body. Biopsies were taken with a cold forceps for Helicobacter pylori testing. - The exam of the stomach was otherwise normal. - The examined duodenum was normal.  Take omeprazole  daily for 4 weeks trial, assess response - Await pathology results. - Patient is a candidate for TIF if  she wishes to manage this and avoid medical therapy (risks of PPI, bone health concerns), will discuss with her FINAL DIAGNOSIS        1. Surgical [P], gastric :       - EROSIVE GASTRITIS.       - IMMUNOHISTOCHEMICAL STAIN FOR H. PYLORI IS NEGATIVE.        2. Surgical [P], esophageal :       - BENIGN SQUAMOUS EPITHELIUM WITH NO SPECIFIC PATHOLOGIC CHANGE.    07/2020 colonoscopy: 2 small sessile serrated polyps removed.  Spastic  colon.  Small internal hemorrhoids.  Good prep.  5-year repeat (due 07/2025).   07/2020 EGD: Mild gastritis, otherwise normal.  No Barrett's.  Biopsies negative for H. pylori.    Past Medical History:  Diagnosis Date   Anxiety    Colon polyps    GERD (gastroesophageal reflux disease)    Hemorrhoids    History of migraine headaches    Insomnia    UTI (urinary tract infection)      Past Surgical History:  Procedure Laterality Date   COLONOSCOPY     ESOPHAGOGASTRODUODENOSCOPY     PARTIAL HYSTERECTOMY     Family History  Problem Relation Age of Onset   Hyperlipidemia Father    Deep vein thrombosis Father    Parkinson's disease Father    Osteoporosis Father    Lupus Sister    Osteopenia Sister    Cancer Maternal Grandmother    Cancer Maternal Grandfather    Colon cancer Neg Hx    Pancreatic cancer Neg Hx    Rectal cancer Neg Hx    Esophageal cancer Neg Hx    Social History[1] Current Outpatient Medications  Medication Sig Dispense Refill   Cholecalciferol (VITAMIN D3) 50 MCG (2000 UT) CAPS 2,000 Units.     estradiol (ESTRACE) 0.01 % CREA vaginal cream Place 1 Applicatorful vaginally 2 (two) times a week.     Magnesium Glycinate 100 MG CAPS One daily     Multiple Vitamin (MULTI-VITAMIN) tablet Take 1 tablet by mouth daily.     No current facility-administered medications for this visit.   Allergies[2]   Review of Systems: All systems reviewed and negative except where noted in HPI.   Physical Exam: BP 92/72   Pulse (!) 59   Ht 5' (1.524 m)   Wt 138 lb (62.6 kg)   BMI 26.95 kg/m  Constitutional: Pleasant,well-developed, female in no acute distress. Neurological: Alert and oriented to person place and time. Psychiatric: Normal mood and affect. Behavior is normal.   ASSESSMENT: 54 y.o. female here for assessment of the following  1. Gastroesophageal reflux disease, unspecified whether esophagitis present   2. Osteopenia, unspecified location   3.  Hemorrhoids, unspecified hemorrhoid type   4. History of colon polyps    Longstanding reflux without Barrett's esophagus.  She is fairly symptomatic with this to include nocturnal pyrosis and regurgitation, which interferes with her quality of sleep.  As above, she has made significant lifestyle changes with her diet and exercise, has lost 20 pounds, unfortunately despite these measures she continues to have reflux symptoms that bother her.  She failed low-dose Pepcid .  Her EGD shows no erosive changes or Barrett's, no evidence of EOE.  A trial of omeprazole  did improve her pyrosis but did interestingly she thinks caused worsening bloating.  We discussed long-term use of PPI.  She is quite fearful of using these drugs over time due to her father's significant bone fracture history and osteoporosis.  We discussed that these medications can slightly increase bone fracture risk, although currently her risk does not seem to be extremely high, she has osteopenia.  That being said I certainly understand her point of view and we can avoid PPIs if that is her preference, she really does not want to use them.  There is concern for increased risk for bone fracture as well with Voquezna and she would want to avoid that as well.   We discussed other options to include high-dose PPI with over-the-counter antacids, TIF, or surgery, based on how much the symptoms bother her and how aggressive she wants to be with her care.  We discussed what TIF and surgery would entail.  To proceed with either of those we would need more objective evidence of reflux either through barium swallow or pH test, and explained what those are.  She wants to hold off on invasive procedures and management at this point in time following discussion of these options.  As such, her preference is to try higher dose Pepcid , 40 mg nightly, and will try some Maalox nightly as well to help prevent nocturnal symptoms.  I reassured her she does not have  Barrett's on 2 EGDs and hopefully her risk for that over time is low.  That being said, over time should she have uncontrolled symptoms it could occur.  She will try this regimen for now and see how things go.  She inquires about other alternative therapies, discussed reflux Gourmet if she wants to try that as well.  She will try Pepcid  and Maalox for the next month or so and let me know how she does, can adjust regimen based on how much her symptoms bother her moving forward.  Discussed her history of hemorrhoids, unclear of her lack of improvement after 1 band is due to other areas causing her problem that have not been treated yet or not.  She does have chronic intermittent hemorrhoid symptoms, currently states she is doing okay and declines further banding today.  She can follow-up as needed for that issue should it recur.  Due for surveillance colonoscopy at the end of this year   PLAN: - discussed all options for reflux, including OTC regimens, PPIs, Voquenza, TIF, surgery. Discussed risks of each and how aggressive she wishes to be with the regimen as above - pepcid  40mg  q HS for 30 day trial - take Maalox PRN q HS - trial of Reflux gourmet PRN - samples if she wants to try it - declines PPI due to long term risks, declines Voquezna, declines TIF / surgery - declines further hemorrhoid banding - not sure if lack of benefit was due to location and she has not completed series yet. Longstanding intermittent smyptoms, f/u If she wishes to reconsider banding - f/u colonoscopy December this year - f/u yearly for reflux otherwise.  I spent 35 minutes of time, including in depth chart review,face-to-face time with the patient, and documentation.  Marcey Naval, MD [facility]    [1]  Social History Tobacco Use   Smoking status: Never   Smokeless tobacco: Never  Vaping Use   Vaping status: Never Used  Substance Use Topics   Alcohol use: Never   Drug use: Never  [2]  No Known Allergies  "

## 2024-09-10 ENCOUNTER — Encounter: Payer: Self-pay | Admitting: Gastroenterology

## 2024-09-10 ENCOUNTER — Ambulatory Visit: Admitting: Gastroenterology

## 2024-09-10 VITALS — BP 92/72 | HR 59 | Ht 60.0 in | Wt 138.0 lb

## 2024-09-10 DIAGNOSIS — K219 Gastro-esophageal reflux disease without esophagitis: Secondary | ICD-10-CM | POA: Diagnosis not present

## 2024-09-10 DIAGNOSIS — M8580 Other specified disorders of bone density and structure, unspecified site: Secondary | ICD-10-CM

## 2024-09-10 DIAGNOSIS — K649 Unspecified hemorrhoids: Secondary | ICD-10-CM | POA: Diagnosis not present

## 2024-09-10 DIAGNOSIS — Z860100 Personal history of colon polyps, unspecified: Secondary | ICD-10-CM | POA: Diagnosis not present

## 2024-09-10 MED ORDER — FAMOTIDINE 40 MG PO TABS: 40.0000 mg | ORAL_TABLET | Freq: Every day | ORAL | 3 refills | Status: AC

## 2024-09-10 NOTE — Patient Instructions (Signed)
 We have sent the following medications to your pharmacy for you to pick up at your convenience: Pepcid  40 mg: Take daily at bedtime  Please purchase the following medications over the counter and take as directed: Maalox : Take daily at bedtime  You can try Reflux Gourmet for reflux  Thank you for entrusting me with your care and for choosing [facility], Dr. Elspeth Naval   _______________________________________________________  If your blood pressure at your visit was 140/90 or greater, please contact your primary care physician to follow up on this.  _______________________________________________________  If you are age 54 or older, your body mass index should be between 23-30. Your Body mass index is 26.95 kg/m. If this is out of the aforementioned range listed, please consider follow up with your Primary Care Provider.  If you are age 54 or younger, your body mass index should be between 19-25. Your Body mass index is 26.95 kg/m. If this is out of the aformentioned range listed, please consider follow up with your Primary Care Provider.   ________________________________________________________  The [facility] providers would like to encourage you to use MYCHART to communicate with providers for non-urgent requests or questions.  Due to long hold times on the telephone, sending your provider a message by [facility] may be a faster and more efficient way to get a response.  Please allow 48 business hours for a response.  Please remember that this is for non-urgent requests.  _______________________________________________________  [facility] is using a team-based approach to care.  Your team is made up of your doctor and two to three APPS. Our APPS (Nurse Practitioners and Physician Assistants) work with your physician to ensure care continuity for you. They are fully qualified to address your health concerns and develop a treatment plan. They communicate  directly with your gastroenterologist to care for you. Seeing the Advanced Practice Practitioners on your physician's team can help you by facilitating care more promptly, often allowing for earlier appointments, access to diagnostic testing, procedures, and other specialty referrals.    SABRA
